# Patient Record
Sex: Female | Born: 2000
Health system: Southern US, Community
[De-identification: ages and names within clinical notes are randomized; demographics above are authoritative.]

## PROBLEM LIST (undated history)

## (undated) DIAGNOSIS — G43109 Migraine with aura, not intractable, without status migrainosus: Secondary | ICD-10-CM

## (undated) DIAGNOSIS — B009 Herpesviral infection, unspecified: Secondary | ICD-10-CM

## (undated) DIAGNOSIS — F909 Attention-deficit hyperactivity disorder, unspecified type: Secondary | ICD-10-CM

## (undated) DIAGNOSIS — J45909 Unspecified asthma, uncomplicated: Secondary | ICD-10-CM

## (undated) HISTORY — DX: Herpesviral infection, unspecified: B00.9

## (undated) HISTORY — PX: WISDOM TOOTH EXTRACTION: SHX21

## (undated) HISTORY — DX: Migraine with aura, not intractable, without status migrainosus: G43.109

## (undated) HISTORY — DX: Attention-deficit hyperactivity disorder, unspecified type: F90.9

## (undated) HISTORY — DX: Unspecified asthma, uncomplicated: J45.909

---
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
## 2020-05-22 ENCOUNTER — Telehealth: Payer: Self-pay

## 2020-05-22 NOTE — Telephone Encounter (Signed)
Left message with Jeanice Lim, ok per DPR.

## 2020-05-22 NOTE — Telephone Encounter (Signed)
Patients mother is calling in regards to patient needing an appointment for a possible UTI or yeast infection. No available appointments, need triage to assist.

## 2020-05-23 ENCOUNTER — Ambulatory Visit (INDEPENDENT_AMBULATORY_CARE_PROVIDER_SITE_OTHER): Payer: BC Managed Care – PPO | Admitting: Obstetrics and Gynecology

## 2020-05-23 ENCOUNTER — Telehealth: Payer: Self-pay

## 2020-05-23 ENCOUNTER — Other Ambulatory Visit (HOSPITAL_COMMUNITY)
Admission: RE | Admit: 2020-05-23 | Discharge: 2020-05-23 | Disposition: A | Discharge status: Home / Self Care | Payer: BC Managed Care – PPO | Source: Ambulatory Visit | Attending: Obstetrics and Gynecology | Admitting: Obstetrics and Gynecology

## 2020-05-23 ENCOUNTER — Other Ambulatory Visit: Payer: Self-pay

## 2020-05-23 ENCOUNTER — Ambulatory Visit: Payer: Self-pay | Admitting: Obstetrics & Gynecology

## 2020-05-23 ENCOUNTER — Encounter: Payer: Self-pay | Admitting: Obstetrics and Gynecology

## 2020-05-23 VITALS — BP 100/70 | HR 76 | Temp 97.5°F | Ht 64.0 in | Wt 121.0 lb

## 2020-05-23 DIAGNOSIS — Z113 Encounter for screening for infections with a predominantly sexual mode of transmission: Secondary | ICD-10-CM

## 2020-05-23 DIAGNOSIS — R300 Dysuria: Secondary | ICD-10-CM | POA: Diagnosis not present

## 2020-05-23 DIAGNOSIS — R319 Hematuria, unspecified: Secondary | ICD-10-CM

## 2020-05-23 DIAGNOSIS — N760 Acute vaginitis: Secondary | ICD-10-CM | POA: Diagnosis not present

## 2020-05-23 LAB — POCT URINALYSIS DIPSTICK
Bilirubin, UA: NEGATIVE
Glucose, UA: NEGATIVE
Ketones, UA: NEGATIVE
Leukocytes, UA: NEGATIVE
Nitrite, UA: NEGATIVE
Protein, UA: NEGATIVE
Urobilinogen, UA: 0.2 E.U./dL
pH, UA: 5 (ref 5.0–8.0)

## 2020-05-23 MED ORDER — SULFAMETHOXAZOLE-TRIMETHOPRIM 800-160 MG PO TABS: 1.0000 | ORAL_TABLET | Freq: Two times a day (BID) | ORAL | 0 refills | Status: DC

## 2020-05-23 MED ORDER — PHENAZOPYRIDINE HCL 200 MG PO TABS: 200.0000 mg | ORAL_TABLET | Freq: Three times a day (TID) | ORAL | 0 refills | Status: DC | PRN

## 2020-05-23 NOTE — Telephone Encounter (Signed)
H/O recurrent UTIs, 2019,2020 Nexplanon inserted 02/2018 AEX 10/2016  Spoke with pt. Pt states having burning and pain with urination and having intermittent lower abd pain x 3 days. Pt states has recurrent UTIs, at least once a year. Denies fever, chills, back pain, urinary urgency, frequency, vaginal discharge or odor. Pt does has standing Macrobid Rx for 1 tab po after intercourse. Pt has not used this. Denies any OTC meds for treatment. Unknown LMP due to Nexplanon.  Advised pt to be seen for further evaluation. Pt scheduled as work-in appt with Dr Hyacinth Meeker today 05/23/20 at 315 pm. Pt verbalized understanding and agreeable CPS neg.   Routing to Dr Hyacinth Meeker for review.  Encounter closed.

## 2020-05-23 NOTE — Telephone Encounter (Signed)
Patient returned call to reschedule appointment.

## 2020-05-23 NOTE — Progress Notes (Signed)
GYNECOLOGY  VISIT   HPI: 19 y.o.   Single  Caucasian  female   G0P0000 with No LMP recorded. Patient has had an implant.   here for burning with urination and having internal vaginal pain. Patient's ex-boyfriend with HSV II and patient concerned she been exposed.  Not sure if she has any lesions.   No vaginal discharge.  She did use a Monistat yesterday to try to help her symptoms.   States it doe not feel like a UTI, but it hurts with voiding.   Some pelvic pain for 2 days.  She has some random pulses in the vagina. She feels like she has growing pains in her lower legs.  No swelling.  No fevers.   Urine Dip: 1+RBCs  GYNECOLOGIC HISTORY: No LMP recorded. Patient has had an implant. Contraception: Nexplanon 02/2018 Lt.arm Menopausal hormone therapy:  N/a Last mammogram:  n/a Last pap smear:   n/a        OB History    Gravida  0   Para  0   Term  0   Preterm  0   AB  0   Living  0     SAB  0   TAB  0   Ectopic  0   Multiple  0   Live Births                 Patient Active Problem List   Diagnosis Date Noted  . Recurrent UTI 03/08/2018  . ADD (attention deficit disorder with hyperactivity) 03/31/2012    Past Medical History:  Diagnosis Date  . ADHD (attention deficit hyperactivity disorder)   . Asthma     Past Surgical History:  Procedure Laterality Date  . CRANIECTOMY FOR CRANIOSYNOSTOSIS  2013   done at [facility]  . WISDOM TOOTH EXTRACTION      Current Outpatient Medications  Medication Sig Dispense Refill  . ADDERALL XR 20 MG 24 hr capsule Take 20 mg by mouth at bedtime.    Marland Kitchen albuterol (VENTOLIN HFA) 108 (90 Base) MCG/ACT inhaler SMARTSIG:1 Puff(s) By Mouth Every 4 Hours PRN     No current facility-administered medications for this visit.     ALLERGIES: Almond (diagnostic), Apple, Carrot [daucus carota], and Pineapple  Family History  Problem Relation Age of Onset  . Diabetes Father        type II  . Throat cancer Father      Social History   Socioeconomic History  . Marital status: Single    Spouse name: Not on file  . Number of children: Not on file  . Years of education: Not on file  . Highest education level: Not on file  Occupational History  . Not on file  Tobacco Use  . Smoking status: Never Smoker  . Smokeless tobacco: Never Used  Substance and Sexual Activity  . Alcohol use: No  . Drug use: No  . Sexual activity: Yes    Birth control/protection: Condom  Other Topics Concern  . Not on file  Social History Narrative  . Not on file   Social Determinants of Health   Financial Resource Strain:   . Difficulty of Paying Living Expenses:   Food Insecurity:   . Worried About Programme researcher, broadcasting/film/video in the Last Year:   . Barista in the Last Year:   Transportation Needs:   . Freight forwarder (Medical):   Marland Kitchen Lack of Transportation (Non-Medical):   Physical Activity:   .  Days of Exercise per Week:   . Minutes of Exercise per Session:   Stress:   . Feeling of Stress :   Social Connections:   . Frequency of Communication with Friends and Family:   . Frequency of Social Gatherings with Friends and Family:   . Attends Religious Services:   . Active Member of Clubs or Organizations:   . Attends Archivist Meetings:   Marland Kitchen Marital Status:   Intimate Partner Violence:   . Fear of Current or Ex-Partner:   . Emotionally Abused:   Marland Kitchen Physically Abused:   . Sexually Abused:     Review of Systems  Genitourinary:       Burning with urination  All other systems reviewed and are negative.   PHYSICAL EXAMINATION:    BP 100/70   Pulse 76   Temp (!) 97.5 F (36.4 C) (Temporal)   Ht 5\' 4"  (1.626 m)   Wt 121 lb (54.9 kg)   BMI 20.77 kg/m     General appearance: alert, cooperative and appears stated age   Pelvic: External genitalia:  no lesions              Urethra:  normal appearing urethra with no masses, tenderness or lesions              Bartholins and Skenes: normal                  Vagina: normal appearing vagina with normal color and ?White discharge, no lesions              Cervix: no lesions.  No CMT.                Bimanual Exam:  Uterus:  normal size, contour, position, consistency, mobility, non-tender              Adnexa: no mass, fullness, tenderness              Chaperone was present for exam.  ASSESSMENT  Dysuria.  Cystitis.  Possible exposure to HSV.   PLAN  STD screening.  Vaginitis testing.  Bactrim DS po bid x 3 days.  Pyridium.  Call for no improvement.    An After Visit Summary was printed and given to the patient.  __19____ minutes face to face time of which over 50% was spent in counseling.

## 2020-05-23 NOTE — Telephone Encounter (Signed)
Patient returned call

## 2020-05-23 NOTE — Telephone Encounter (Signed)
Spoke with patient. OV rescheduled for today at 4:30pm with Dr. Edward Jolly.  Covid 19 prescreen negative, precautions review. Patient is aware of arrival time of 4:15pm for check in.   Routing to provider for final review. Patient is agreeable to disposition. Will close encounter.   Cc: Dr. Hyacinth Meeker

## 2020-05-23 NOTE — Telephone Encounter (Signed)
Patients appointment for UTI was cancelled for today due to doctor being in surgery. Needs to be rescheduled, no available slots, route to triage for assistance.

## 2020-05-23 NOTE — Telephone Encounter (Signed)
ll to patient to offer work-in appointment with Dr Edward Jolly at [phone].  Unable to reach patient. Voice mailbox is full.

## 2020-05-23 NOTE — Patient Instructions (Signed)
Urinary Tract Infection, Adult A urinary tract infection (UTI) is an infection of any part of the urinary tract. The urinary tract includes:  The kidneys.  The ureters.  The bladder.  The urethra. These organs make, store, and get rid of pee (urine) in the body. What are the causes? This is caused by germs (bacteria) in your genital area. These germs grow and cause swelling (inflammation) of your urinary tract. What increases the risk? You are more likely to develop this condition if:  You have a small, thin tube (catheter) to drain pee.  You cannot control when you pee or poop (incontinence).  You are female, and: ? You use these methods to prevent pregnancy:  A medicine that kills sperm (spermicide).  A device that blocks sperm (diaphragm). ? You have low levels of a female hormone (estrogen). ? You are pregnant.  You have genes that add to your risk.  You are sexually active.  You take antibiotic medicines.  You have trouble peeing because of: ? A prostate that is bigger than normal, if you are female. ? A blockage in the part of your body that drains pee from the bladder (urethra). ? A kidney stone. ? A nerve condition that affects your bladder (neurogenic bladder). ? Not getting enough to drink. ? Not peeing often enough.  You have other conditions, such as: ? Diabetes. ? A weak disease-fighting system (immune system). ? Sickle cell disease. ? Gout. ? Injury of the spine. What are the signs or symptoms? Symptoms of this condition include:  Needing to pee right away (urgently).  Peeing often.  Peeing small amounts often.  Pain or burning when peeing.  Blood in the pee.  Pee that smells bad or not like normal.  Trouble peeing.  Pee that is cloudy.  Fluid coming from the vagina, if you are female.  Pain in the belly or lower back. Other symptoms include:  Throwing up (vomiting).  No urge to eat.  Feeling mixed up (confused).  Being tired  and grouchy (irritable).  A fever.  Watery poop (diarrhea). How is this treated? This condition may be treated with:  Antibiotic medicine.  Other medicines.  Drinking enough water. Follow these instructions at home:  Medicines  Take over-the-counter and prescription medicines only as told by your doctor.  If you were prescribed an antibiotic medicine, take it as told by your doctor. Do not stop taking it even if you start to feel better. General instructions  Make sure you: ? Pee until your bladder is empty. ? Do not hold pee for a long time. ? Empty your bladder after sex. ? Wipe from front to back after pooping if you are a female. Use each tissue one time when you wipe.  Drink enough fluid to keep your pee pale yellow.  Keep all follow-up visits as told by your doctor. This is important. Contact a doctor if:  You do not get better after 1-2 days.  Your symptoms go away and then come back. Get help right away if:  You have very bad back pain.  You have very bad pain in your lower belly.  You have a fever.  You are sick to your stomach (nauseous).  You are throwing up. Summary  A urinary tract infection (UTI) is an infection of any part of the urinary tract.  This condition is caused by germs in your genital area.  There are many risk factors for a UTI. These include having a small, thin   tube to drain pee and not being able to control when you pee or poop.  Treatment includes antibiotic medicines for germs.  Drink enough fluid to keep your pee pale yellow. This information is not intended to replace advice given to you by your health care provider. Make sure you discuss any questions you have with your health care provider. Document Revised: 11/19/2018 Document Reviewed: 06/11/2018 Elsevier Patient Education  2020 Elsevier Inc.  

## 2020-05-23 NOTE — Progress Notes (Deleted)
GYNECOLOGY  VISIT  CC:   ***  HPI: 19 y.o. G0P0000 Single White or Caucasian female here for uti.  GYNECOLOGIC HISTORY: No LMP recorded. Patient has had an implant. Contraception: nexplanon Menopausal hormone therapy: none  Patient Active Problem List   Diagnosis Date Noted  . Recurrent UTI 03/08/2018  . ADD (attention deficit disorder with hyperactivity) 03/31/2012    Past Medical History:  Diagnosis Date  . ADHD (attention deficit hyperactivity disorder)   . Asthma     Past Surgical History:  Procedure Laterality Date  . CRANIECTOMY FOR CRANIOSYNOSTOSIS  2013   done at [facility]  . WISDOM TOOTH EXTRACTION      MEDS:   Current Outpatient Medications on File Prior to Visit  Medication Sig Dispense Refill  . ADDERALL XR 30 MG 24 hr capsule Take 1 tablet by mouth daily.  0  . ALBUTEROL IN Inhale into the lungs as needed.    . metroNIDAZOLE (METROGEL) 0.75 % vaginal gel Place 1 Applicatorful vaginally at bedtime. Use for 5 nights 70 g 0  . nitrofurantoin, macrocrystal-monohydrate, (MACROBID) 100 MG capsule 1 tab po within 30 minutes of intercourse 30 capsule 1  . phenazopyridine (PYRIDIUM) 200 MG tablet Take 1 tablet (200 mg total) by mouth 3 (three) times daily as needed. 6 tablet 0  . sulfamethoxazole-trimethoprim (BACTRIM DS) 800-160 MG tablet Take 1 tablet by mouth 2 (two) times daily. One PO BID x 3 days 6 tablet 0   No current facility-administered medications on file prior to visit.    ALLERGIES: Almond (diagnostic), Apple, Carrot [daucus carota], and Pineapple  Family History  Problem Relation Age of Onset  . Diabetes Father        type II  . Throat cancer Father     SH:  ***  Review of Systems  PHYSICAL EXAMINATION:    There were no vitals taken for this visit.    General appearance: alert, cooperative and appears stated age Neck: no adenopathy, supple, symmetrical, trachea midline and thyroid {CHL AMB PHY EX THYROID NORM DEFAULT:[phone]::"normal to  inspection and palpation"} CV:  {Exam; heart brief:31539} Lungs:  {pe lungs ob:314451::"clear to auscultation, no wheezes, rales or rhonchi, symmetric air entry"} Breasts: {Exam; breast:13139::"normal appearance, no masses or tenderness"} Abdomen: soft, non-tender; bowel sounds normal; no masses,  no organomegaly Lymph:  no inguinal LAD noted  Pelvic: External genitalia:  no lesions              Urethra:  normal appearing urethra with no masses, tenderness or lesions              Bartholins and Skenes: normal                 Vagina: normal appearing vagina with normal color and discharge, no lesions              Cervix: {CHL AMB PHY EX CERVIX NORM [phone]::"no lesions"}              Bimanual Exam:  Uterus:  {CHL AMB PHY EX UTERUS NORM DEFAULT:[phone]::"normal size, contour, position, consistency, mobility, non-tender"}              Adnexa: {CHL AMB PHY EX ADNEXA NO MASS [phone]::"no mass, fullness, tenderness"}              Rectovaginal: {yes no:314532}.  Confirms.              Anus:  normal sphincter tone, no lesions  Chaperone, ***Terence Lux, CMA, was present  for exam.  Assessment: ***  Plan: ***   ~{NUMBERS; -10-45 JOINT ROM:10287} minutes spent with patient >50% of time was in face to face discussion of above.

## 2020-05-24 LAB — HSV 1 AND 2 IGM ABS, INDIRECT
HSV 1 IgM: <1:10 {titer}
HSV 2 IgM: <1:10 {titer}

## 2020-05-24 LAB — HEP, RPR, HIV PANEL
HIV Screen 4th Generation wRfx: NONREACTIVE
Hepatitis B Surface Ag: NEGATIVE
RPR Ser Ql: NONREACTIVE

## 2020-05-24 LAB — HSV(HERPES SIMPLEX VRS) I + II AB-IGG
HSV 1 Glycoprotein G Ab, IgG: <0.91 index (ref 0.00–0.90)
HSV 2 IgG, Type Spec: <0.91 index (ref 0.00–0.90)

## 2020-05-24 LAB — URINALYSIS, MICROSCOPIC ONLY
Bacteria, UA: NONE SEEN
Casts: NONE SEEN /lpf

## 2020-05-24 LAB — HEPATITIS C ANTIBODY: Hep C Virus Ab: <0.1 s/co ratio (ref 0.0–0.9)

## 2020-05-25 LAB — CERVICOVAGINAL ANCILLARY ONLY
Bacterial Vaginitis (gardnerella): NEGATIVE
Candida Glabrata: NEGATIVE
Candida Vaginitis: NEGATIVE
Chlamydia: NEGATIVE
Comment: NEGATIVE
Comment: NEGATIVE
Comment: NEGATIVE
Comment: NEGATIVE
Comment: NEGATIVE
Comment: NORMAL
Neisseria Gonorrhea: NEGATIVE
Trichomonas: NEGATIVE

## 2020-05-25 LAB — URINE CULTURE: Organism ID, Bacteria: NO GROWTH

## 2020-05-29 ENCOUNTER — Telehealth: Payer: Self-pay | Admitting: Obstetrics and Gynecology

## 2020-05-29 DIAGNOSIS — R319 Hematuria, unspecified: Secondary | ICD-10-CM

## 2020-05-29 NOTE — Telephone Encounter (Signed)
Patient called asking if Dr.Silva has reviewed her recent results.

## 2020-05-29 NOTE — Telephone Encounter (Signed)
Spoke with pt. Pt given results and recommendations per Dr Edward Jolly. Pt agreeable and verbalized understanding. Pt states not having any pain or UTI sx. Pt states started cycle on 6/13 and finished abx on 6/12.  Advised will let know if Dr Hyacinth Meeker wants to have a recheck urine due to red blood cells. Denies any bleeding in urine while taking abx.   Routing to Dr Hyacinth Meeker to advise on recheck of urine?   Candace Salles, MD  P [facility] Triage Pool Cc: Jerene Bears, MD Please contact patient with results of testing and to schedule follow up.  This is Dr. Danna Hefty patient that I saw in her absence for an office visit.   She tested negative for herpes type I and II, HIV, syphilis, hepatitis B and C, gonorrhea, chlamydia, and trichomonas.   Her vaginitis testing is negative for yeast and bacterial vaginosis.   Her urine microscopic exam showed red blood cells and her urine culture is negative for infection.  She can stop her antibiotic if she has not already completed the course.   I would recommend a follow up visit to see how her pain is doing and to recheck a urine.

## 2020-05-30 NOTE — Telephone Encounter (Signed)
Yes, I would like her to return for a urine micro only.  Does not need MD visit for this.  Thanks.

## 2020-05-30 NOTE — Telephone Encounter (Signed)
Spoke with pt. Pt scheduled repeat Micro UA only for nurse visit on 06/01/20 at 3:45 pm per pt's request of date and time. Pt verbalized understanding of appt.  Orders placed.  Routing to Dr Hyacinth Meeker for review.  Encounter closed.

## 2020-05-30 NOTE — Telephone Encounter (Signed)
Spoke to pt. Pt given recommendations per Dr Hyacinth Meeker. Pt ok to give urine sample for repeat micro only. Pt states will call back after 3 pm today when off work to schedule.

## 2020-06-01 ENCOUNTER — Other Ambulatory Visit: Payer: Self-pay

## 2020-06-01 ENCOUNTER — Ambulatory Visit (INDEPENDENT_AMBULATORY_CARE_PROVIDER_SITE_OTHER): Payer: BC Managed Care – PPO

## 2020-06-01 DIAGNOSIS — R319 Hematuria, unspecified: Secondary | ICD-10-CM | POA: Diagnosis not present

## 2020-06-01 NOTE — Progress Notes (Signed)
Patient in office for a repeat microscopic urine. Patient states that she has completed the course of antibiotics and pyridium. Clean-catch urine obtained and sent to the lab to be resulted. Routing to provider for final review and closing encounter.

## 2020-06-02 LAB — URINALYSIS, MICROSCOPIC ONLY
Bacteria, UA: NONE SEEN
Casts: NONE SEEN /lpf
WBC, UA: NONE SEEN /hpf (ref 0–5)

## 2020-06-08 ENCOUNTER — Telehealth: Payer: Self-pay

## 2020-06-08 NOTE — Telephone Encounter (Signed)
Mailbox full. Try again. 

## 2020-06-08 NOTE — Telephone Encounter (Signed)
-----   Message from Jerene Bears, MD sent at 06/07/2020  4:20 PM EDT ----- Please let pt know her urine micro still shows some blood.  There does not appear to be any infection.  She has amorphous sediment crystals in her urine so may have stones.  She should have a urology consultation for this.  I would recommend Dr. Arita Miss.  Thanks.

## 2020-06-08 NOTE — Telephone Encounter (Signed)
Patient notified of results. See lab 

## 2020-07-24 ENCOUNTER — Other Ambulatory Visit: Payer: Self-pay | Admitting: Obstetrics and Gynecology

## 2020-08-17 ENCOUNTER — Other Ambulatory Visit: Payer: Self-pay | Admitting: Urology

## 2020-08-17 DIAGNOSIS — R311 Benign essential microscopic hematuria: Secondary | ICD-10-CM

## 2020-08-24 ENCOUNTER — Ambulatory Visit
Admission: RE | Admit: 2020-08-24 | Discharge: 2020-08-24 | Disposition: A | Discharge status: Home / Self Care | Payer: BC Managed Care – PPO | Source: Ambulatory Visit | Attending: Urology | Admitting: Urology

## 2020-08-24 DIAGNOSIS — R311 Benign essential microscopic hematuria: Secondary | ICD-10-CM

## 2020-11-06 ENCOUNTER — Telehealth: Payer: Self-pay

## 2020-11-06 DIAGNOSIS — Z3046 Encounter for surveillance of implantable subdermal contraceptive: Secondary | ICD-10-CM

## 2020-11-06 NOTE — Telephone Encounter (Signed)
Spoke with patient regarding benefits for scheduled Nexplanon Removal. Patient acknowledges understanding of information presented. Encounter closed.

## 2020-11-06 NOTE — Telephone Encounter (Signed)
Patient is returning call.  °

## 2020-11-06 NOTE — Telephone Encounter (Signed)
Patient want to discuss nexplanon removal.

## 2020-11-06 NOTE — Telephone Encounter (Signed)
Left message for pt to return call to triage RN. 

## 2020-11-06 NOTE — Telephone Encounter (Signed)
Nexplanon placed 02/2018 with Dr Hyacinth Meeker, due for removal 02/2021. LMP 11/15  Spoke with pt. Pt requesting Nexplanon removal due to having irregular, long cycles. Pt states changing a tampon every 2-3 hours and cycles lasting 2.5 weeks on average. Pt states got Nexplanon placed due to " not wanting monthly cycles" Pt states using 1.5 boxes of tampons every month for bleeding. Pt denies any heavy bleeding, soaking tampons every 1 hour or less, clots, cramps. Denies feeling weak, dizzy or lightheaded.   Advised pt to have OV for removal and discussion of new birth control method. Pt agreeable. Pt scheduled with Dr Edward Jolly on 11/30 at 930 am for Nexplanon removal and discussion. Pt verbalized understanding to date and time of appt. Pt advised to remain abstinent until appt. Pt agreeable.  Routing to Dr Edward Jolly for review Cc: Hayley for precert  Orders placed  Encounter closed

## 2020-11-08 NOTE — Progress Notes (Signed)
GYNECOLOGY  VISIT   HPI: 19 y.o.   Single  Caucasian  female   G0P0000 with Patient's last menstrual period was 10/31/2020 (approximate).   here for Nexplanon removal. She would like to go back on the pill.   Lots of irregular bleeding with Nexplanon.   She wants to be a on a pill.  Had a rash with OrthoEvra.    She has migraines with possible auditory aura.  She gets ringing in her ears prior to migraines.   GYNECOLOGIC HISTORY: Patient's last menstrual period was 10/31/2020 (approximate). Contraception:  Nexplanon 02/2018 Lt.arm Menopausal hormone therapy:  n/a Last mammogram:  n/a Last pap smear:   n/a        OB History    Gravida  0   Para  0   Term  0   Preterm  0   AB  0   Living  0     SAB  0   TAB  0   Ectopic  0   Multiple  0   Live Births                 Patient Active Problem List   Diagnosis Date Noted  . Recurrent UTI 03/08/2018  . ADD (attention deficit disorder with hyperactivity) 03/31/2012    Past Medical History:  Diagnosis Date  . ADHD (attention deficit hyperactivity disorder)   . Asthma     Past Surgical History:  Procedure Laterality Date  . CRANIECTOMY FOR CRANIOSYNOSTOSIS  2013   done at [facility]  . WISDOM TOOTH EXTRACTION      Current Outpatient Medications  Medication Sig Dispense Refill  . ADDERALL XR 20 MG 24 hr capsule Take 20 mg by mouth at bedtime.    Marland Kitchen albuterol (VENTOLIN HFA) 108 (90 Base) MCG/ACT inhaler SMARTSIG:1 Puff(s) By Mouth Every 4 Hours PRN    . etonogestrel (NEXPLANON) 68 MG IMPL implant 1 each by Subdermal route once.     No current facility-administered medications for this visit.     ALLERGIES: Almond (diagnostic), Apple, and Carrot [daucus carota]  Family History  Problem Relation Age of Onset  . Diabetes Father        type II  . Throat cancer Father     Social History   Socioeconomic History  . Marital status: Single    Spouse name: Not on file  . Number of children: Not on file  .  Years of education: Not on file  . Highest education level: Not on file  Occupational History  . Not on file  Tobacco Use  . Smoking status: Never Smoker  . Smokeless tobacco: Never Used  Substance and Sexual Activity  . Alcohol use: No  . Drug use: No  . Sexual activity: Yes    Birth control/protection: Condom  Other Topics Concern  . Not on file  Social History Narrative  . Not on file   Social Determinants of Health   Financial Resource Strain:   . Difficulty of Paying Living Expenses: Not on file  Food Insecurity:   . Worried About Programme researcher, broadcasting/film/video in the Last Year: Not on file  . Ran Out of Food in the Last Year: Not on file  Transportation Needs:   . Lack of Transportation (Medical): Not on file  . Lack of Transportation (Non-Medical): Not on file  Physical Activity:   . Days of Exercise per Week: Not on file  . Minutes of Exercise per Session: Not on file  Stress:   . Feeling of Stress : Not on file  Social Connections:   . Frequency of Communication with Friends and Family: Not on file  . Frequency of Social Gatherings with Friends and Family: Not on file  . Attends Religious Services: Not on file  . Active Member of Clubs or Organizations: Not on file  . Attends Banker Meetings: Not on file  . Marital Status: Not on file  Intimate Partner Violence:   . Fear of Current or Ex-Partner: Not on file  . Emotionally Abused: Not on file  . Physically Abused: Not on file  . Sexually Abused: Not on file    Review of Systems  All other systems reviewed and are negative.   PHYSICAL EXAMINATION:    BP 100/62   Pulse 70   Ht 5\' 4"  (1.626 m)   Wt 126 lb 12.8 oz (57.5 kg)   LMP 10/31/2020 (Approximate)   BMI 21.77 kg/m     General appearance: alert, cooperative and appears stated age   Extremities: extremities normal, atraumatic, no cyanosis or edema   Left arm - Nexplanon removal.  Nexplanon palpated. Betadine prep. Local 1% lidocaine - lot  11/02/2020 m exp 5/24.  Incision with scalpel. Hemostat used to remove Nexplanon intact, shown to patient, and discarded. Steristrips placed.  Sterile gauze wrap.  No complications.  Minimal EBL.  Chaperone was present for exam.  ASSESSMENT  Nexplanon removal.  Migraine with possible aura.  Need for contraception.   PLAN  Post Nexplanon removal precautions to patient.  Expect potential menses. Contraceptive counseling given.   Micronor Rx for 3 months.  Instructed in use.  Follow up for annual exam.

## 2020-11-14 ENCOUNTER — Encounter: Payer: Self-pay | Admitting: Obstetrics and Gynecology

## 2020-11-14 ENCOUNTER — Ambulatory Visit (INDEPENDENT_AMBULATORY_CARE_PROVIDER_SITE_OTHER): Payer: BC Managed Care – PPO | Admitting: Obstetrics and Gynecology

## 2020-11-14 ENCOUNTER — Other Ambulatory Visit: Payer: Self-pay

## 2020-11-14 VITALS — BP 100/62 | HR 70 | Ht 64.0 in | Wt 126.8 lb

## 2020-11-14 DIAGNOSIS — Z3009 Encounter for other general counseling and advice on contraception: Secondary | ICD-10-CM

## 2020-11-14 DIAGNOSIS — Z3046 Encounter for surveillance of implantable subdermal contraceptive: Secondary | ICD-10-CM | POA: Diagnosis not present

## 2020-11-14 MED ORDER — NORETHINDRONE 0.35 MG PO TABS: 1.0000 | ORAL_TABLET | Freq: Every day | ORAL | 0 refills | Status: DC

## 2021-01-03 ENCOUNTER — Telehealth: Payer: Self-pay | Admitting: *Deleted

## 2021-01-03 NOTE — Telephone Encounter (Signed)
Patient called and left message in triage voicemail c/o UTI, I called patient back and explained office needed for treatment. Sent message to appointments to schedule.

## 2021-01-08 NOTE — Progress Notes (Signed)
20 y.o. G0P0000 Single White or Caucasian female here for annual exam.    Has hx severe headaches, localized in pin-point area over left eye. Last the whole day when it comes, takes OTC migraine medicine and it helps if she takes it early enough. In the past 12 months has had 4 headaches. Has never been seen by neurology for headaches.  Currently taking progestin only birth control pills for contraception. Has a hard time remembering them on time. She is a Consulting civil engineer at [employer] and her schedule varies and is busy. Per note on office visit with Dr. Edward Jolly on 11/14/2020, migraines with possible auditory aura with ringing in ears prior to migraines.  Pt desires more effective birth control pills, agrees to consult neurology for opinion regarding safety of combination oral contraception (estrogen plus progestin).  Also discussed pros and cons of IUD, suggested Kyleena, pt is not interested in IUD at this time. Tried Nexplanon, bled constantly.    Reports odor in urine x 2-3 days. Reports always has a little blood in urine, has been evaluated with urology. Used to take macrobid after intercourse but ran out of Rx several months ago. Has not had a bladder infection in the last 6 months. Will send urine off for culture.   Patient's last menstrual period was 01/02/2021 (exact date).          Sexually active: Yes.    The current method of family planning is oral progesterone-only contraceptive.    Exercising: Yes.    gym Smoker:  no  Health Maintenance: Pap:  none History of abnormal Pap:  no MMG:  none Colonoscopy:  none BMD:   none TDaP:  2012 Gardasil:  completed  Covid-19: not done Hep C testing: neg 2021   reports that she has never smoked. She has never used smokeless tobacco. She reports that she does not drink alcohol and does not use drugs.  Past Medical History:  Diagnosis Date  . ADHD (attention deficit hyperactivity disorder)   . Asthma   . Migraine with aura    auditroy aura     Past Surgical History:  Procedure Laterality Date  . CRANIECTOMY FOR CRANIOSYNOSTOSIS  2013   done at [facility]  . WISDOM TOOTH EXTRACTION      Current Outpatient Medications  Medication Sig Dispense Refill  . ADDERALL XR 20 MG 24 hr capsule Take 20 mg by mouth at bedtime.    Marland Kitchen albuterol (VENTOLIN HFA) 108 (90 Base) MCG/ACT inhaler SMARTSIG:1 Puff(s) By Mouth Every 4 Hours PRN    . norethindrone (MICRONOR) 0.35 MG tablet Take 1 tablet (0.35 mg total) by mouth daily. 84 tablet 0   No current facility-administered medications for this visit.    Family History  Problem Relation Age of Onset  . Diabetes Father        type II  . Throat cancer Father     Review of Systems  Constitutional: Negative.   HENT: Negative.   Eyes: Negative.   Respiratory: Negative.   Cardiovascular: Negative.   Gastrointestinal: Negative.   Endocrine: Negative.   Genitourinary: Negative.  Negative for dysuria, frequency and urgency.       Odor in urine  Musculoskeletal: Negative.   Skin: Negative.   Allergic/Immunologic: Negative.   Neurological: Negative.   Hematological: Negative.   Psychiatric/Behavioral: Negative.     Exam:   Ht 5\' 4"  (1.626 m)   Wt 122 lb (55.3 kg)   LMP 01/02/2021 (Exact Date)   BMI 20.94 kg/m  Height: 5\' 4"  (162.6 cm)  General appearance: alert, cooperative and appears stated age, no acute distress Head: Hx craniectomy  Neck: no adenopathy, thyroid normal to inspection and palpation Lungs: clear to auscultation bilaterally Breasts: Not assessed today r/t age Heart: regular rate and rhythm NEG CVAT Abdomen: soft, non-tender; no masses,  no organomegaly Extremities: extremities normal, no edema Skin: No rashes or lesions Lymph nodes: Cervical, supraclavicular, and axillary nodes normal. No abnormal inguinal nodes palpated Neurologic: Grossly normal   Pelvic: External genitalia:  no lesions              Urethra:  normal appearing urethra with no masses,  tenderness or lesions              Bartholins and Skenes: normal                 Vagina: normal appearing vagina, appropriate for age, yellow/tan discharge              Cervix: neg cervical motion tenderness, no visible lesions             Bimanual Exam:   Uterus:  normal size, contour, position, consistency, mobility, non-tender              Adnexa: no mass, fullness, tenderness                 Joy, CMA Chaperone was present for exam.  A:  Well woman exam with routine gynecological exam  Laboratory exam ordered as part of routine general medical examination - Plan: Urinalysis,Complete w/RFL Culture  Screen for STD (sexually transmitted disease) - Plan: WET PREP FOR TRICH, YEAST, CLUE, C. trachomatis/N. gonorrhoeae RNA  Hematuria, unspecified type - Plan: nitrofurantoin, macrocrystal-monohydrate, (MACROBID) 100 MG capsule  Surveillance of previously prescribed contraceptive pill - Plan: norethindrone (MICRONOR) 0.35 MG tablet  Periodic headache syndrome, not intractable - Plan: Ambulatory referral to Neurology    P:   Pap : due at age 64  Labs: GC/CT  Medications: Refill Norethindrone 0.35, #3 packs/4 RF    Macrobid 100mg  BID x 7 days  Discussed after Neurology consult, will further discuss birth control plan. Information provided about Kyleena IUD. Encouraged condom use with Norethindrone.

## 2021-01-09 ENCOUNTER — Encounter: Payer: Self-pay | Admitting: Nurse Practitioner

## 2021-01-09 ENCOUNTER — Other Ambulatory Visit: Payer: Self-pay

## 2021-01-09 ENCOUNTER — Ambulatory Visit (INDEPENDENT_AMBULATORY_CARE_PROVIDER_SITE_OTHER): Payer: BC Managed Care – PPO | Admitting: Nurse Practitioner

## 2021-01-09 ENCOUNTER — Encounter: Payer: Self-pay | Admitting: Neurology

## 2021-01-09 VITALS — BP 110/78 | HR 70 | Resp 16 | Ht 64.0 in | Wt 122.0 lb

## 2021-01-09 DIAGNOSIS — Z113 Encounter for screening for infections with a predominantly sexual mode of transmission: Secondary | ICD-10-CM

## 2021-01-09 DIAGNOSIS — R319 Hematuria, unspecified: Secondary | ICD-10-CM

## 2021-01-09 DIAGNOSIS — Z3041 Encounter for surveillance of contraceptive pills: Secondary | ICD-10-CM

## 2021-01-09 DIAGNOSIS — N3090 Cystitis, unspecified without hematuria: Secondary | ICD-10-CM

## 2021-01-09 DIAGNOSIS — Z01419 Encounter for gynecological examination (general) (routine) without abnormal findings: Secondary | ICD-10-CM | POA: Diagnosis not present

## 2021-01-09 DIAGNOSIS — Z0000 Encounter for general adult medical examination without abnormal findings: Secondary | ICD-10-CM

## 2021-01-09 DIAGNOSIS — G43C0 Periodic headache syndromes in child or adult, not intractable: Secondary | ICD-10-CM

## 2021-01-09 LAB — WET PREP FOR TRICH, YEAST, CLUE

## 2021-01-09 MED ORDER — NITROFURANTOIN MONOHYD MACRO 100 MG PO CAPS: 100.0000 mg | ORAL_CAPSULE | Freq: Two times a day (BID) | ORAL | 0 refills | Status: DC

## 2021-01-09 MED ORDER — NORETHINDRONE 0.35 MG PO TABS: 1.0000 | ORAL_TABLET | Freq: Every day | ORAL | 4 refills | Status: DC

## 2021-01-09 NOTE — Patient Instructions (Addendum)
Health Maintenance, Female Adopting a healthy lifestyle and getting preventive care are important in promoting health and wellness. Ask your health care provider about:  The right schedule for you to have regular tests and exams.  Things you can do on your own to prevent diseases and keep yourself healthy. What should I know about diet, weight, and exercise? Eat a healthy diet  Eat a diet that includes plenty of vegetables, fruits, low-fat dairy products, and lean protein.  Do not eat a lot of foods that are high in solid fats, added sugars, or sodium.   Maintain a healthy weight Body mass index (BMI) is used to identify weight problems. It estimates body fat based on height and weight. Your health care provider can help determine your BMI and help you achieve or maintain a healthy weight. Get regular exercise Get regular exercise. This is one of the most important things you can do for your health. Most adults should:  Exercise for at least 150 minutes each week. The exercise should increase your heart rate and make you sweat (moderate-intensity exercise).  Do strengthening exercises at least twice a week. This is in addition to the moderate-intensity exercise.  Spend less time sitting. Even light physical activity can be beneficial. Watch cholesterol and blood lipids Have your blood tested for lipids and cholesterol at 20 years of age, then have this test every 5 years. Have your cholesterol levels checked more often if:  Your lipid or cholesterol levels are high.  You are older than 20 years of age.  You are at high risk for heart disease. What should I know about cancer screening? Depending on your health history and family history, you may need to have cancer screening at various ages. This may include screening for:  Breast cancer.  Cervical cancer.  Colorectal cancer.  Skin cancer.  Lung cancer. What should I know about heart disease, diabetes, and high blood  pressure? Blood pressure and heart disease  High blood pressure causes heart disease and increases the risk of stroke. This is more likely to develop in people who have high blood pressure readings, are of African descent, or are overweight.  Have your blood pressure checked: ? Every 3-5 years if you are 18-39 years of age. ? Every year if you are 40 years old or older. Diabetes Have regular diabetes screenings. This checks your fasting blood sugar level. Have the screening done:  Once every three years after age 40 if you are at a normal weight and have a low risk for diabetes.  More often and at a younger age if you are overweight or have a high risk for diabetes. What should I know about preventing infection? Hepatitis B If you have a higher risk for hepatitis B, you should be screened for this virus. Talk with your health care provider to find out if you are at risk for hepatitis B infection. Hepatitis C Testing is recommended for:  Everyone born from 1945 through 1965.  Anyone with known risk factors for hepatitis C. Sexually transmitted infections (STIs)  Get screened for STIs, including gonorrhea and chlamydia, if: ? You are sexually active and are younger than 20 years of age. ? You are older than 20 years of age and your health care provider tells you that you are at risk for this type of infection. ? Your sexual activity has changed since you were last screened, and you are at increased risk for chlamydia or gonorrhea. Ask your health care provider   if you are at risk.  Ask your health care provider about whether you are at high risk for HIV. Your health care provider may recommend a prescription medicine to help prevent HIV infection. If you choose to take medicine to prevent HIV, you should first get tested for HIV. You should then be tested every 3 months for as long as you are taking the medicine. Pregnancy  If you are about to stop having your period (premenopausal) and  you may become pregnant, seek counseling before you get pregnant.  Take 400 to 800 micrograms (mcg) of folic acid every day if you become pregnant.  Ask for birth control (contraception) if you want to prevent pregnancy. Osteoporosis and menopause Osteoporosis is a disease in which the bones lose minerals and strength with aging. This can result in bone fractures. If you are 63 years old or older, or if you are at risk for osteoporosis and fractures, ask your health care provider if you should:  Be screened for bone loss.  Take a calcium or vitamin D supplement to lower your risk of fractures.  Be given hormone replacement therapy (HRT) to treat symptoms of menopause. Follow these instructions at home: Lifestyle  Do not use any products that contain nicotine or tobacco, such as cigarettes, e-cigarettes, and chewing tobacco. If you need help quitting, ask your health care provider.  Do not use street drugs.  Do not share needles.  Ask your health care provider for help if you need support or information about quitting drugs. Alcohol use  Do not drink alcohol if: ? Your health care provider tells you not to drink. ? You are pregnant, may be pregnant, or are planning to become pregnant.  If you drink alcohol: ? Limit how much you use to 0-1 drink a day. ? Limit intake if you are breastfeeding.  Be aware of how much alcohol is in your drink. In the [place] one drink equals one 12 oz bottle of beer (355 mL), one 5 oz glass of wine (148 mL), or one 1 oz glass of hard liquor (44 mL). General instructions  Schedule regular health, dental, and eye exams.  Stay current with your vaccines.  Tell your health care provider if: ? You often feel depressed. ? You have ever been abused or do not feel safe at home. Summary  Adopting a healthy lifestyle and getting preventive care are important in promoting health and wellness.  Follow your health care provider's instructions about healthy  diet, exercising, and getting tested or screened for diseases.  Follow your health care provider's instructions on monitoring your cholesterol and blood pressure. This information is not intended to replace advice given to you by your health care provider. Make sure you discuss any questions you have with your health care provider. Document Revised: 11/25/2018 Document Reviewed: 11/25/2018 Elsevier Patient Education  2021 [employer] Inc[employer]  Urinary Tract Infection, Adult A urinary tract infection (UTI) is an infection of any part of the urinary tract. The urinary tract includes:  The kidneys.  The ureters.  The bladder.  The urethra. These organs make, store, and get rid of pee (urine) in the body. What are the causes? This infection is caused by germs (bacteria) in your genital area. These germs grow and cause swelling (inflammation) of your urinary tract. What increases the risk? The following factors may make you more likely to develop this condition:  Using a small, thin tube (catheter) to drain pee.  Not being able to control when  you pee or poop (incontinence).  Being female. If you are female, these things can increase the risk: ? Using these methods to prevent pregnancy:  A medicine that kills sperm (spermicide).  A device that blocks sperm (diaphragm). ? Having low levels of a female hormone (estrogen). ? Being pregnant. You are more likely to develop this condition if:  You have genes that add to your risk.  You are sexually active.  You take antibiotic medicines.  You have trouble peeing because of: ? A prostate that is bigger than normal, if you are female. ? A blockage in the part of your body that drains pee from the bladder. ? A kidney stone. ? A nerve condition that affects your bladder. ? Not getting enough to drink. ? Not peeing often enough.  You have other conditions, such as: ? Diabetes. ? A weak disease-fighting system (immune system). ? Sickle  cell disease. ? Gout. ? Injury of the spine. What are the signs or symptoms? Symptoms of this condition include:  Needing to pee right away.  Peeing small amounts often.  Pain or burning when peeing.  Blood in the pee.  Pee that smells bad or not like normal.  Trouble peeing.  Pee that is cloudy.  Fluid coming from the vagina, if you are female.  Pain in the belly or lower back. Other symptoms include:  Vomiting.  Not feeling hungry.  Feeling mixed up (confused). This may be the first symptom in older adults.  Being tired and grouchy (irritable).  A fever.  Watery poop (diarrhea). How is this treated?  Taking antibiotic medicine.  Taking other medicines.  Drinking enough water. In some cases, you may need to see a specialist. Follow these instructions at home: Medicines  Take over-the-counter and prescription medicines only as told by your doctor.  If you were prescribed an antibiotic medicine, take it as told by your doctor. Do not stop taking it even if you start to feel better. General instructions  Make sure you: ? Pee until your bladder is empty. ? Do not hold pee for a long time. ? Empty your bladder after sex. ? Wipe from front to back after peeing or pooping if you are a female. Use each tissue one time when you wipe.  Drink enough fluid to keep your pee pale yellow.  Keep all follow-up visits.   Contact a doctor if:  You do not get better after 1-2 days.  Your symptoms go away and then come back. Get help right away if:  You have very bad back pain.  You have very bad pain in your lower belly.  You have a fever.  You have chills.  You feeling like you will vomit or you vomit. Summary  A urinary tract infection (UTI) is an infection of any part of the urinary tract.  This condition is caused by germs in your genital area.  There are many risk factors for a UTI.  Treatment includes antibiotic medicines.  Drink enough fluid  to keep your pee pale yellow. This information is not intended to replace advice given to you by your health care provider. Make sure you discuss any questions you have with your health care provider. Document Revised: 07/14/2020 Document Reviewed: 07/14/2020 Elsevier Patient Education  2021 [employer].

## 2021-01-10 LAB — C. TRACHOMATIS/N. GONORRHOEAE RNA
C. trachomatis RNA, TMA: NOT DETECTED
N. gonorrhoeae RNA, TMA: NOT DETECTED

## 2021-01-12 LAB — URINALYSIS, COMPLETE W/RFL CULTURE
Bilirubin Urine: NEGATIVE
Glucose, UA: NEGATIVE
Hyaline Cast: NONE SEEN /LPF
Ketones, ur: NEGATIVE
Leukocyte Esterase: NEGATIVE
Nitrites, Initial: NEGATIVE
Protein, ur: NEGATIVE
Specific Gravity, Urine: 1.025 (ref 1.001–1.03)
pH: 6.5 (ref 5.0–8.0)

## 2021-01-12 LAB — URINE CULTURE
MICRO NUMBER:: 11454073
SPECIMEN QUALITY:: ADEQUATE

## 2021-01-12 LAB — CULTURE INDICATED

## 2021-02-05 ENCOUNTER — Other Ambulatory Visit: Payer: Self-pay | Admitting: Obstetrics and Gynecology

## 2021-02-05 DIAGNOSIS — Z3041 Encounter for surveillance of contraceptive pills: Secondary | ICD-10-CM

## 2021-03-16 NOTE — Progress Notes (Deleted)
NEUROLOGY CONSULTATION NOTE  Candace Spencer MRN: [id number] DOB: [birth year]  Referring provider: Clarita Crane, NP Primary care provider: Jonette Mate, MD  Reason for consult:  headache  Assessment/Plan:   ***   Subjective:  Candace Spencer is a 20 year old ***-handed female with migraines, ADHD and history of craniosynostosis s/p craniectomy who presents for headaches.  History supplemented by referring provider's note.  Onset:  *** Location:  *** Quality:  *** Intensity:  ***.  *** denies new headache, thunderclap headache or severe headache that wakes *** from sleep. Aura:  *** Prodrome:  *** Postdrome:  *** Associated symptoms:  ***.  *** denies associated unilateral numbness or weakness. Duration:  *** Frequency:  *** Frequency of abortive medication: *** Triggers:  *** Relieving factors:  *** Activity:  ***  Current NSAIDS/analgesics:  *** Current triptans:  none Current ergotamine:  none Current anti-emetic:  none Current muscle relaxants:  none Current Antihypertensive medications:  none Current Antidepressant medications:  none Current Anticonvulsant medications:  none Current anti-CGRP:  none Current Vitamins/Herbal/Supplements:  none Current Antihistamines/Decongestants:  none Other therapy:  none Hormone/birth control:  norethindrone Other medications:  Adderall XR  Past NSAIDS/analgesics:  *** Past abortive triptans:  *** Past abortive ergotamine:  none Past muscle relaxants:  none Past anti-emetic:  none Past antihypertensive medications:  none Past antidepressant medications:  none Past anticonvulsant medications:  none Past anti-CGRP:  none Past vitamins/Herbal/Supplements:  none Past antihistamines/decongestants:  none Other past therapies:  none  Caffeine:  *** Alcohol:  *** Smoker:  *** Diet:  *** Exercise:  *** Depression:  ***; Anxiety:  *** Other pain:  *** Sleep hygiene:  *** Family history of headache:   ***      PAST MEDICAL HISTORY: Past Medical History:  Diagnosis Date  . ADHD (attention deficit hyperactivity disorder)   . Asthma   . Migraine with aura    auditroy aura    PAST SURGICAL HISTORY: Past Surgical History:  Procedure Laterality Date  . CRANIECTOMY FOR CRANIOSYNOSTOSIS  2013   done at [facility]  . WISDOM TOOTH EXTRACTION      MEDICATIONS: Current Outpatient Medications on File Prior to Visit  Medication Sig Dispense Refill  . ADDERALL XR 20 MG 24 hr capsule Take 20 mg by mouth at bedtime.    Marland Kitchen albuterol (VENTOLIN HFA) 108 (90 Base) MCG/ACT inhaler SMARTSIG:1 Puff(s) By Mouth Every 4 Hours PRN    . nitrofurantoin, macrocrystal-monohydrate, (MACROBID) 100 MG capsule Take 1 capsule (100 mg total) by mouth 2 (two) times daily. 14 capsule 0  . norethindrone (MICRONOR) 0.35 MG tablet Take 1 tablet (0.35 mg total) by mouth daily. 84 tablet 4   No current facility-administered medications on file prior to visit.    ALLERGIES: Allergies  Allergen Reactions  . Almond (Diagnostic) Itching  . Apple Itching  . Carrot [Daucus Carota] Itching    FAMILY HISTORY: Family History  Problem Relation Age of Onset  . Diabetes Father        type II  . Throat cancer Father     Objective:  *** General: No acute distress.  Patient appears well-groomed.   Head:  Normocephalic/atraumatic Eyes:  fundi examined but not visualized Neck: supple, no paraspinal tenderness, full range of motion Back: No paraspinal tenderness Heart: regular rate and rhythm Lungs: Clear to auscultation bilaterally. Vascular: No carotid bruits. Neurological Exam: Mental status: alert and oriented to person, place, and time, recent and remote memory intact, fund of knowledge intact, attention  and concentration intact, speech fluent and not dysarthric, language intact. Cranial nerves: CN I: not tested CN II: pupils equal, round and reactive to light, visual fields intact CN III, IV, VI:  full range of  motion, no nystagmus, no ptosis CN V: facial sensation intact. CN VII: upper and lower face symmetric CN VIII: hearing intact CN IX, X: gag intact, uvula midline CN XI: sternocleidomastoid and trapezius muscles intact CN XII: tongue midline Bulk & Tone: normal, no fasciculations. Motor:  muscle strength 5/5 throughout Sensation:  Pinprick, temperature and vibratory sensation intact. Deep Tendon Reflexes:  2+ throughout,  toes downgoing.   Finger to nose testing:  Without dysmetria.   Heel to shin:  Without dysmetria.   Gait:  Normal station and stride.  Romberg negative.    Thank you for allowing me to take part in the care of this patient.  Shon Millet, DO  CC: ***

## 2021-03-19 ENCOUNTER — Ambulatory Visit: Payer: BC Managed Care – PPO | Admitting: Neurology

## 2021-03-19 ENCOUNTER — Encounter: Payer: Self-pay | Admitting: Neurology

## 2021-03-19 DIAGNOSIS — Z029 Encounter for administrative examinations, unspecified: Secondary | ICD-10-CM

## 2021-03-23 ENCOUNTER — Ambulatory Visit (HOSPITAL_BASED_OUTPATIENT_CLINIC_OR_DEPARTMENT_OTHER): Payer: BC Managed Care – PPO | Admitting: Obstetrics & Gynecology

## 2021-03-23 ENCOUNTER — Encounter (HOSPITAL_BASED_OUTPATIENT_CLINIC_OR_DEPARTMENT_OTHER): Payer: Self-pay

## 2021-05-02 ENCOUNTER — Ambulatory Visit (HOSPITAL_BASED_OUTPATIENT_CLINIC_OR_DEPARTMENT_OTHER): Payer: BC Managed Care – PPO | Admitting: Obstetrics & Gynecology

## 2021-05-07 ENCOUNTER — Ambulatory Visit (HOSPITAL_BASED_OUTPATIENT_CLINIC_OR_DEPARTMENT_OTHER): Payer: BC Managed Care – PPO | Admitting: Obstetrics & Gynecology

## 2021-06-25 ENCOUNTER — Ambulatory Visit (HOSPITAL_BASED_OUTPATIENT_CLINIC_OR_DEPARTMENT_OTHER): Payer: BC Managed Care – PPO | Admitting: Obstetrics & Gynecology

## 2021-07-10 ENCOUNTER — Other Ambulatory Visit: Payer: Self-pay

## 2021-07-10 ENCOUNTER — Encounter: Payer: Self-pay | Admitting: Obstetrics and Gynecology

## 2021-07-10 ENCOUNTER — Ambulatory Visit: Payer: BC Managed Care – PPO | Admitting: Obstetrics and Gynecology

## 2021-07-10 VITALS — BP 128/70 | HR 87 | Ht 63.0 in | Wt 130.0 lb

## 2021-07-10 DIAGNOSIS — B9689 Other specified bacterial agents as the cause of diseases classified elsewhere: Secondary | ICD-10-CM

## 2021-07-10 DIAGNOSIS — R109 Unspecified abdominal pain: Secondary | ICD-10-CM | POA: Diagnosis not present

## 2021-07-10 DIAGNOSIS — N760 Acute vaginitis: Secondary | ICD-10-CM

## 2021-07-10 DIAGNOSIS — R300 Dysuria: Secondary | ICD-10-CM

## 2021-07-10 LAB — WET PREP FOR TRICH, YEAST, CLUE

## 2021-07-10 MED ORDER — BETAMETHASONE VALERATE 0.1 % EX OINT: 1.0000 "application " | TOPICAL_OINTMENT | Freq: Two times a day (BID) | CUTANEOUS | 0 refills | Status: DC

## 2021-07-10 MED ORDER — METRONIDAZOLE 0.75 % VA GEL: 1.0000 | Freq: Every day | VAGINAL | 0 refills | Status: DC

## 2021-07-10 NOTE — Progress Notes (Signed)
GYNECOLOGY  VISIT   HPI: 20 y.o.   Single White or Caucasian Not Hispanic or Latino  female   [id number] with No LMP recorded.   here for  pain after urination  and vaginal discomfort present for about 3-4 weeks. Some discomfort when the urine hits the skin. No urinary frequency or urgency. Slight increase in thin vaginal discharge, + odor. No itching.   Mild, random lower abdominal discomfort for years. No diarrhea or constipation. She does c/o increased gas.   Last testing for STD's in 1/22, same partner. Doesn't feel she needs STD testing.  In the past she was given prophylactic antibiotics to take with intercourse. She was asking about that again. She has had one UTI in the last year that she doesn't think occurred from sex.   GYNECOLOGIC HISTORY: No LMP recorded. Contraception:OCP  Menopausal hormone therapy: none         OB History     Gravida  0   Para  0   Term  0   Preterm  0   AB  0   Living  0      SAB  0   IAB  0   Ectopic  0   Multiple  0   Live Births                 Patient Active Problem List   Diagnosis Date Noted   Recurrent UTI 03/08/2018   ADD (attention deficit disorder with hyperactivity) 03/31/2012    Past Medical History:  Diagnosis Date   ADHD (attention deficit hyperactivity disorder)    Asthma    Migraine with aura    auditroy aura    Past Surgical History:  Procedure Laterality Date   CRANIECTOMY FOR CRANIOSYNOSTOSIS  2013   done at [facility] TOOTH EXTRACTION      Current Outpatient Medications  Medication Sig Dispense Refill   ADDERALL XR 20 MG 24 hr capsule Take 20 mg by mouth at bedtime.     albuterol (VENTOLIN HFA) 108 (90 Base) MCG/ACT inhaler SMARTSIG:1 Puff(s) By Mouth Every 4 Hours PRN     nitrofurantoin, macrocrystal-monohydrate, (MACROBID) 100 MG capsule Take 1 capsule (100 mg total) by mouth 2 (two) times daily. 14 capsule 0   norethindrone (MICRONOR) 0.35 MG tablet Take 1 tablet (0.35 mg total) by  mouth daily. 84 tablet 4   No current facility-administered medications for this visit.     ALLERGIES: Almond (diagnostic), Apple, and Carrot [daucus carota]  Family History  Problem Relation Age of Onset   Diabetes Father        type II   Throat cancer Father     Social History   Socioeconomic History   Marital status: Single    Spouse name: Not on file   Number of children: Not on file   Years of education: Not on file   Highest education level: Not on file  Occupational History   Not on file  Tobacco Use   Smoking status: Never   Smokeless tobacco: Never  Substance and Sexual Activity   Alcohol use: No   Drug use: No   Sexual activity: Yes    Partners: Male    Birth control/protection: Condom, Pill  Other Topics Concern   Not on file  Social History Narrative   Not on file   Social Determinants of Health   Financial Resource Strain: Not on file  Food Insecurity: Not on file  Transportation Needs: Not on  file  Physical Activity: Not on file  Stress: Not on file  Social Connections: Not on file  Intimate Partner Violence: Not on file    Review of Systems  Genitourinary:  Positive for dysuria.   PHYSICAL EXAMINATION:    There were no vitals taken for this visit.    General appearance: alert, cooperative and appears stated age  Pelvic: External genitalia:  no lesions              Urethra:  normal appearing urethra with no masses, tenderness or lesions              Bartholins and Skenes: normal                 Vagina: slightly erythematous appearing vagina with an increase of both thin and thick vaginal discharge.              Cervix: no cervical motion tenderness and no lesions              Bimanual Exam:  Uterus:  normal size, contour, position, consistency, mobility, non-tender and anteverted              Adnexa: no mass, fullness, tenderness               Chaperone was present for exam.  1. Acute vaginitis - WET PREP FOR TRICH, YEAST, CLUE: +clue  and odor - betamethasone valerate ointment (VALISONE) 0.1 %; Apply 1 application topically 2 (two) times daily. Use prn irritation  Dispense: 15 g; Refill: 0  2. Dysuria Seems more external than internal - Urinalysis,Complete w/RFL Culture  3. Bacterial vaginitis - metroNIDAZOLE (METROGEL) 0.75 % vaginal gel; Place 1 Applicatorful vaginally at bedtime. Use qhs x 5 nights  Dispense: 70 g; Refill: 0  4. Abdominal discomfort Normal pelvic exam, doubt gyn source. Suspect GI -Can try gas-ex or beano

## 2021-07-10 NOTE — Patient Instructions (Signed)
Bacterial Vaginosis °Bacterial vaginosis is an infection that occurs when the normal balance of bacteria in the vagina changes. This change is caused by an overgrowth of certain bacteria in the vagina. Bacterial vaginosis is the most common vaginal infection among females aged 20 to 44 years. °This condition increases the risk of sexually transmitted infections (STIs). Treatment can help reduce this risk. Treatment is very important for pregnant women because this condition can cause babies to be born early (prematurely) or at a low birth weight. °What are the causes? °This condition is caused by an increase in harmful bacteria that are normally present in small amounts in the vagina. However, the exact reason this condition develops is not known. °You cannot get bacterial vaginosis from toilet seats, bedding, swimming pools, or contact with objects around you. °What increases the risk? °The following factors may make you more likely to develop this condition: °Having a new sexual partner or multiple sexual partners, or having unprotected sex. °Douching. °Having an intrauterine device (IUD). °Smoking. °Abusing drugs and alcohol. This may lead to riskier sexual behavior. °Taking certain antibiotic medicines. °Being pregnant. °What are the signs or symptoms? °Some women with this condition have no symptoms. Symptoms may include: °Gray or white vaginal discharge. The discharge can be watery or foamy. °A fish-like odor with discharge, especially after sex or during menstruation. °Itching in and around the vagina. °Burning or pain with urination. °How is this diagnosed? °This condition is diagnosed based on: °Your medical history. °A physical exam of the vagina. °Checking a sample of vaginal fluid for harmful bacteria or abnormal cells. °How is this treated? °This condition is treated with antibiotic medicines. These may be given as a pill, a vaginal cream, or a medicine that is put into the vagina (suppository). If the  condition comes back after treatment, a second round of antibiotics may be needed. °Follow these instructions at home: °Medicines °Take or apply over-the-counter and prescription medicines only as told by your health care provider. °Take or apply your antibiotic medicine as told by your health care provider. Do not stop using the antibiotic even if you start to feel better. °General instructions °If you have a female sexual partner, tell her that you have a vaginal infection. She should follow up with her health care provider. If you have a female sexual partner, he does not need treatment. °Avoid sexual activity until you finish treatment. °Drink enough fluid to keep your urine pale yellow. °Keep the area around your vagina and rectum clean. °Wash the area daily with warm water. °Wipe yourself from front to back after using the toilet. °If you are breastfeeding, talk to your health care provider about continuing breastfeeding during treatment. °Keep all follow-up visits. This is important. °How is this prevented? °Self-care °Do not douche. °Wash the outside of your vagina with warm water only. °Wear cotton or cotton-lined underwear. °Avoid wearing tight pants and pantyhose, especially during the summer. °Safe sex °Use protection when having sex. This includes: °Using condoms. °Using dental dams. This is a thin layer of a material made of latex or polyurethane that protects the mouth during oral sex. °Limit the number of sexual partners. To help prevent bacterial vaginosis, it is best to have sex with just one partner (monogamous relationship). °Make sure you and your sexual partner are tested for STIs. °Drugs and alcohol °Do not use any products that contain nicotine or tobacco. These products include cigarettes, chewing tobacco, and vaping devices, such as e-cigarettes. If you need help quitting,   ask your health care provider. °Do not use drugs. °Do not drink alcohol if: °Your health care provider tells you not to  do this. °You are pregnant, may be pregnant, or are planning to become pregnant. °If you drink alcohol: °Limit how much you have to 0-1 drink a day. °Be aware of how much alcohol is in your drink. In the [place] one drink equals one 12 oz bottle of beer (355 mL), one 5 oz glass of wine (148 mL), or one 1½ oz glass of hard liquor (44 mL). °Where to find more information °Centers for Disease Control and Prevention: www.cdc.gov °American Sexual Health Association (ASHA): www.ashastd.org °[place] Department of Health and Human Services, Office on Women's Health: www.womenshealth.gov °Contact a health care provider if: °Your symptoms do not improve, even after treatment. °You have more discharge or pain when urinating. °You have a fever or chills. °You have pain in your abdomen or pelvis. °You have pain during sex. °You have vaginal bleeding between menstrual periods. °Summary °Bacterial vaginosis is a vaginal infection that occurs when the normal balance of bacteria in the vagina changes. It results from an overgrowth of certain bacteria. °This condition increases the risk of sexually transmitted infections (STIs). Getting treated can help reduce this risk. °Treatment is very important for pregnant women because this condition can cause babies to be born early (prematurely) or at low birth weight. °This condition is treated with antibiotic medicines. These may be given as a pill, a vaginal cream, or a medicine that is put into the vagina (suppository). °This information is not intended to replace advice given to you by your health care provider. Make sure you discuss any questions you have with your health care provider. °Document Revised: 06/01/2020 Document Reviewed: 06/01/2020 °Elsevier Patient Education © 2022 [employer]vier Inc. ° °

## 2021-07-12 LAB — URINALYSIS, COMPLETE W/RFL CULTURE
Bilirubin Urine: NEGATIVE
Glucose, UA: NEGATIVE
Hyaline Cast: NONE SEEN /LPF
Ketones, ur: NEGATIVE
Leukocyte Esterase: NEGATIVE
Nitrites, Initial: NEGATIVE
Protein, ur: NEGATIVE
Specific Gravity, Urine: 1.02 (ref 1.001–1.035)
pH: 5.5 (ref 5.0–8.0)

## 2021-07-12 LAB — URINE CULTURE
MICRO NUMBER:: 12163803
SPECIMEN QUALITY:: ADEQUATE

## 2021-07-12 LAB — CULTURE INDICATED

## 2021-07-27 ENCOUNTER — Telehealth: Payer: Self-pay | Admitting: *Deleted

## 2021-07-27 NOTE — Telephone Encounter (Signed)
Patient called Triage with complaints of Urine odor,Dysuria,Urinary frequency. Request to be seen today. We don't have any openings today, discussed urgent care options. Pt states she would rather wait to see if we have an apt next week. I will send a message to [facility]-appointment pool for scheduling.

## 2021-08-01 ENCOUNTER — Ambulatory Visit: Payer: BC Managed Care – PPO | Admitting: Obstetrics and Gynecology

## 2021-08-01 ENCOUNTER — Other Ambulatory Visit: Payer: Self-pay

## 2021-08-01 ENCOUNTER — Encounter: Payer: Self-pay | Admitting: Obstetrics and Gynecology

## 2021-08-01 VITALS — BP 110/64 | HR 77 | Ht 63.0 in | Wt 133.0 lb

## 2021-08-01 DIAGNOSIS — N3090 Cystitis, unspecified without hematuria: Secondary | ICD-10-CM

## 2021-08-01 MED ORDER — NITROFURANTOIN MONOHYD MACRO 100 MG PO CAPS: 100.0000 mg | ORAL_CAPSULE | Freq: Two times a day (BID) | ORAL | 0 refills | Status: DC

## 2021-08-01 NOTE — Patient Instructions (Signed)
Urinary Tract Infection, Adult A urinary tract infection (UTI) is an infection of any part of the urinary tract. The urinary tract includes the kidneys, ureters, bladder, and urethra. These organs make, store, and get rid of urine in the body. An upper UTI affects the ureters and kidneys. A lower UTI affects the bladder and urethra. What are the causes? Most urinary tract infections are caused by bacteria in your genital area around your urethra, where urine leaves your body. These bacteria grow and cause inflammation of your urinary tract. What increases the risk? You are more likely to develop this condition if: You have a urinary catheter that stays in place. You are not able to control when you urinate or have a bowel movement (incontinence). You are female and you: Use a spermicide or diaphragm for birth control. Have low estrogen levels. Are pregnant. You have certain genes that increase your risk. You are sexually active. You take antibiotic medicines. You have a condition that causes your flow of urine to slow down, such as: An enlarged prostate, if you are female. Blockage in your urethra. A kidney stone. A nerve condition that affects your bladder control (neurogenic bladder). Not getting enough to drink, or not urinating often. You have certain medical conditions, such as: Diabetes. A weak disease-fighting system (immunesystem). Sickle cell disease. Gout. Spinal cord injury. What are the signs or symptoms? Symptoms of this condition include: Needing to urinate right away (urgency). Frequent urination. This may include small amounts of urine each time you urinate. Pain or burning with urination. Blood in the urine. Urine that smells bad or unusual. Trouble urinating. Cloudy urine. Vaginal discharge, if you are female. Pain in the abdomen or the lower back. You may also have: Vomiting or a decreased appetite. Confusion. Irritability or tiredness. A fever or  chills. Diarrhea. The first symptom in older adults may be confusion. In some cases, they may not have any symptoms until the infection has worsened. How is this diagnosed? This condition is diagnosed based on your medical history and a physical exam. You may also have other tests, including: Urine tests. Blood tests. Tests for STIs (sexually transmitted infections). If you have had more than one UTI, a cystoscopy or imaging studies may be done to determine the cause of the infections. How is this treated? Treatment for this condition includes: Antibiotic medicine. Over-the-counter medicines to treat discomfort. Drinking enough water to stay hydrated. If you have frequent infections or have other conditions such as a kidney stone, you may need to see a health care provider who specializes in the urinary tract (urologist). In rare cases, urinary tract infections can cause sepsis. Sepsis is a life-threatening condition that occurs when the body responds to an infection. Sepsis is treated in the hospital with IV antibiotics, fluids, and other medicines. Follow these instructions at home: Medicines Take over-the-counter and prescription medicines only as told by your health care provider. If you were prescribed an antibiotic medicine, take it as told by your health care provider. Do not stop using the antibiotic even if you start to feel better. General instructions Make sure you: Empty your bladder often and completely. Do not hold urine for long periods of time. Empty your bladder after sex. Wipe from front to back after urinating or having a bowel movement if you are female. Use each tissue only one time when you wipe. Drink enough fluid to keep your urine pale yellow. Keep all follow-up visits. This is important. Contact a health care provider   if: Your symptoms do not get better after 1-2 days. Your symptoms go away and then return. Get help right away if: You have severe pain in your  back or your lower abdomen. You have a fever or chills. You have nausea or vomiting. Summary A urinary tract infection (UTI) is an infection of any part of the urinary tract, which includes the kidneys, ureters, bladder, and urethra. Most urinary tract infections are caused by bacteria in your genital area. Treatment for this condition often includes antibiotic medicines. If you were prescribed an antibiotic medicine, take it as told by your health care provider. Do not stop using the antibiotic even if you start to feel better. Keep all follow-up visits. This is important. This information is not intended to replace advice given to you by your health care provider. Make sure you discuss any questions you have with your health care provider. Document Revised: 07/14/2020 Document Reviewed: 07/14/2020 Elsevier Patient Education  2022 Elsevier Inc.  

## 2021-08-01 NOTE — Progress Notes (Signed)
GYNECOLOGY  VISIT   HPI: 20 y.o.   Single White or Caucasian Not Hispanic or Latino  female   G0P0000 with No LMP recorded.   here for patient states that her urine has a strange odor and pain after urination.  She was treated for BV at the end of July.  Current symptoms started about a week ago., after she finished her metrogel. She has a strong odor to her urine, it it to the point that it is embarassing. No urinary urgency or frequency to urinate. She has some discomfort, itchy sensation at her urethra after voiding. She reports that this is her typical presentation with a UTI. No flank pain, no abdominal pain, no fever She is having frequent sex and thinks this is the cause. She always voids after intercourse and wipes front to back. Hydrates well. She is wondering about antibiotic suppression with intercourse.  She had one documented UTI in the last year, in 1/22.   She has a h/o microscopic hematuria, has seen a Insurance underwriter and had a negative evaluation a year or two ago.   GYNECOLOGIC HISTORY: No LMP recorded. Contraception:OCP  Menopausal hormone therapy: none         OB History     Gravida  0   Para  0   Term  0   Preterm  0   AB  0   Living  0      SAB  0   IAB  0   Ectopic  0   Multiple  0   Live Births                 Patient Active Problem List   Diagnosis Date Noted   Recurrent UTI 03/08/2018   ADD (attention deficit disorder with hyperactivity) 03/31/2012    Past Medical History:  Diagnosis Date   ADHD (attention deficit hyperactivity disorder)    Asthma    Migraine with aura    auditroy aura    Past Surgical History:  Procedure Laterality Date   CRANIECTOMY FOR CRANIOSYNOSTOSIS  2013   done at [facility] TOOTH EXTRACTION      Current Outpatient Medications  Medication Sig Dispense Refill   ADDERALL XR 20 MG 24 hr capsule Take 20 mg by mouth at bedtime.     albuterol (VENTOLIN HFA) 108 (90 Base) MCG/ACT inhaler SMARTSIG:1  Puff(s) By Mouth Every 4 Hours PRN     betamethasone valerate ointment (VALISONE) 0.1 % Apply 1 application topically 2 (two) times daily. Use prn irritation 15 g 0   metroNIDAZOLE (METROGEL) 0.75 % vaginal gel Place 1 Applicatorful vaginally at bedtime. Use qhs x 5 nights 70 g 0   norethindrone (MICRONOR) 0.35 MG tablet Take 1 tablet (0.35 mg total) by mouth daily. 84 tablet 4   No current facility-administered medications for this visit.     ALLERGIES: Almond (diagnostic), Apple, and Carrot [daucus carota]  Family History  Problem Relation Age of Onset   Diabetes Father        type II   Throat cancer Father     Social History   Socioeconomic History   Marital status: Single    Spouse name: Not on file   Number of children: Not on file   Years of education: Not on file   Highest education level: Not on file  Occupational History   Not on file  Tobacco Use   Smoking status: Never   Smokeless tobacco: Never  Substance and  Sexual Activity   Alcohol use: No   Drug use: No   Sexual activity: Yes    Partners: Male    Birth control/protection: Condom, Pill  Other Topics Concern   Not on file  Social History Narrative   Not on file   Social Determinants of Health   Financial Resource Strain: Not on file  Food Insecurity: Not on file  Transportation Needs: Not on file  Physical Activity: Not on file  Stress: Not on file  Social Connections: Not on file  Intimate Partner Violence: Not on file    Review of Systems  All other systems reviewed and are negative.  PHYSICAL EXAMINATION:    There were no vitals taken for this visit.    General appearance: alert, cooperative and appears stated age CVA: not tender Abdomen: soft, non-tender; non distended, no masses,  no organomegaly  Urine dip: 0-5 WBC, 20-40 RBC, moderate bacteria, spg 1.023, negative nitrates.  (She is not on her cycle)  1. Cystitis Suspected cystitis. Same as typical presentation. - nitrofurantoin,  macrocrystal-monohydrate, (MACROBID) 100 MG capsule; Take 1 capsule (100 mg total) by mouth 2 (two) times daily.  Dispense: 10 capsule; Refill: 0 - Urinalysis,Complete w/RFL Culture -Would not put her on antibiotic suppression with intercourse yet (would treat if she has 2 infections in 6 months or 3 in a year)

## 2021-08-04 LAB — URINALYSIS, COMPLETE W/RFL CULTURE
Bilirubin Urine: NEGATIVE
Glucose, UA: NEGATIVE
Hyaline Cast: NONE SEEN /LPF
Ketones, ur: NEGATIVE
Leukocyte Esterase: NEGATIVE
Nitrites, Initial: NEGATIVE
Protein, ur: NEGATIVE
Specific Gravity, Urine: 1.023 (ref 1.001–1.035)
pH: 5.5 (ref 5.0–8.0)

## 2021-08-04 LAB — URINE CULTURE
MICRO NUMBER:: 12254845
SPECIMEN QUALITY:: ADEQUATE

## 2021-08-04 LAB — CULTURE INDICATED

## 2021-08-08 ENCOUNTER — Other Ambulatory Visit: Payer: Self-pay | Admitting: *Deleted

## 2021-08-08 MED ORDER — SULFAMETHOXAZOLE-TRIMETHOPRIM 800-160 MG PO TABS
1.0000 | ORAL_TABLET | Freq: Two times a day (BID) | ORAL | 0 refills | Status: DC
Start: 2021-08-08 — End: 2021-10-09

## 2021-10-09 ENCOUNTER — Other Ambulatory Visit: Payer: Self-pay

## 2021-10-09 ENCOUNTER — Encounter: Payer: Self-pay | Admitting: Obstetrics and Gynecology

## 2021-10-09 ENCOUNTER — Ambulatory Visit: Payer: BC Managed Care – PPO | Admitting: Obstetrics and Gynecology

## 2021-10-09 VITALS — BP 122/70 | HR 66 | Ht 63.0 in | Wt 131.8 lb

## 2021-10-09 DIAGNOSIS — Z3041 Encounter for surveillance of contraceptive pills: Secondary | ICD-10-CM

## 2021-10-09 DIAGNOSIS — N3090 Cystitis, unspecified without hematuria: Secondary | ICD-10-CM | POA: Diagnosis not present

## 2021-10-09 MED ORDER — NORETHINDRONE 0.35 MG PO TABS: 1.0000 | ORAL_TABLET | Freq: Every day | ORAL | 0 refills | Status: DC

## 2021-10-09 MED ORDER — PHENAZOPYRIDINE HCL 200 MG PO TABS: 200.0000 mg | ORAL_TABLET | Freq: Three times a day (TID) | ORAL | 0 refills | Status: DC

## 2021-10-09 MED ORDER — SULFAMETHOXAZOLE-TRIMETHOPRIM 800-160 MG PO TABS: 1.0000 | ORAL_TABLET | Freq: Two times a day (BID) | ORAL | 0 refills | Status: DC

## 2021-10-09 NOTE — Patient Instructions (Signed)
Urinary Tract Infection, Adult A urinary tract infection (UTI) is an infection of any part of the urinary tract. The urinary tract includes the kidneys, ureters, bladder, and urethra. These organs make, store, and get rid of urine in the body. An upper UTI affects the ureters and kidneys. A lower UTI affects the bladder and urethra. What are the causes? Most urinary tract infections are caused by bacteria in your genital area around your urethra, where urine leaves your body. These bacteria grow and cause inflammation of your urinary tract. What increases the risk? You are more likely to develop this condition if: You have a urinary catheter that stays in place. You are not able to control when you urinate or have a bowel movement (incontinence). You are female and you: Use a spermicide or diaphragm for birth control. Have low estrogen levels. Are pregnant. You have certain genes that increase your risk. You are sexually active. You take antibiotic medicines. You have a condition that causes your flow of urine to slow down, such as: An enlarged prostate, if you are female. Blockage in your urethra. A kidney stone. A nerve condition that affects your bladder control (neurogenic bladder). Not getting enough to drink, or not urinating often. You have certain medical conditions, such as: Diabetes. A weak disease-fighting system (immunesystem). Sickle cell disease. Gout. Spinal cord injury. What are the signs or symptoms? Symptoms of this condition include: Needing to urinate right away (urgency). Frequent urination. This may include small amounts of urine each time you urinate. Pain or burning with urination. Blood in the urine. Urine that smells bad or unusual. Trouble urinating. Cloudy urine. Vaginal discharge, if you are female. Pain in the abdomen or the lower back. You may also have: Vomiting or a decreased appetite. Confusion. Irritability or tiredness. A fever or  chills. Diarrhea. The first symptom in older adults may be confusion. In some cases, they may not have any symptoms until the infection has worsened. How is this diagnosed? This condition is diagnosed based on your medical history and a physical exam. You may also have other tests, including: Urine tests. Blood tests. Tests for STIs (sexually transmitted infections). If you have had more than one UTI, a cystoscopy or imaging studies may be done to determine the cause of the infections. How is this treated? Treatment for this condition includes: Antibiotic medicine. Over-the-counter medicines to treat discomfort. Drinking enough water to stay hydrated. If you have frequent infections or have other conditions such as a kidney stone, you may need to see a health care provider who specializes in the urinary tract (urologist). In rare cases, urinary tract infections can cause sepsis. Sepsis is a life-threatening condition that occurs when the body responds to an infection. Sepsis is treated in the hospital with IV antibiotics, fluids, and other medicines. Follow these instructions at home: Medicines Take over-the-counter and prescription medicines only as told by your health care provider. If you were prescribed an antibiotic medicine, take it as told by your health care provider. Do not stop using the antibiotic even if you start to feel better. General instructions Make sure you: Empty your bladder often and completely. Do not hold urine for long periods of time. Empty your bladder after sex. Wipe from front to back after urinating or having a bowel movement if you are female. Use each tissue only one time when you wipe. Drink enough fluid to keep your urine pale yellow. Keep all follow-up visits. This is important. Contact a health care provider   if: Your symptoms do not get better after 1-2 days. Your symptoms go away and then return. Get help right away if: You have severe pain in your  back or your lower abdomen. You have a fever or chills. You have nausea or vomiting. Summary A urinary tract infection (UTI) is an infection of any part of the urinary tract, which includes the kidneys, ureters, bladder, and urethra. Most urinary tract infections are caused by bacteria in your genital area. Treatment for this condition often includes antibiotic medicines. If you were prescribed an antibiotic medicine, take it as told by your health care provider. Do not stop using the antibiotic even if you start to feel better. Keep all follow-up visits. This is important. This information is not intended to replace advice given to you by your health care provider. Make sure you discuss any questions you have with your health care provider. Document Revised: 07/14/2020 Document Reviewed: 07/14/2020 Elsevier Patient Education  2022 Elsevier Inc.  

## 2021-10-09 NOTE — Progress Notes (Signed)
GYNECOLOGY  VISIT   HPI: 20 y.o.   Single White or Caucasian Not Hispanic or Latino  female   G0P0000 with No LMP recorded.   here for dysuria and urine odor. She feels like she gets these symptoms after sex.   Symptoms started 1-2 weeks ago. Initially she had urinary urgency, dysuria and an odor. She took azo for 2 days and it helped some. Currently has an odor to her urine, mild dysuria.  No flank pain, no fevers.  She just finished her cycle yesterday.  She had a documented UTI in 1/22 and in 8/22.   No vaginal c/o. No STD concerns. She is sexually active, same partner for almost a year.   GYNECOLOGIC HISTORY: No LMP recorded. Contraception:OCP Menopausal hormone therapy: none         OB History     Gravida  0   Para  0   Term  0   Preterm  0   AB  0   Living  0      SAB  0   IAB  0   Ectopic  0   Multiple  0   Live Births                 Patient Active Problem List   Diagnosis Date Noted   Recurrent UTI 03/08/2018   ADD (attention deficit disorder with hyperactivity) 03/31/2012    Past Medical History:  Diagnosis Date   ADHD (attention deficit hyperactivity disorder)    Asthma    Migraine with aura    auditroy aura    Past Surgical History:  Procedure Laterality Date   CRANIECTOMY FOR CRANIOSYNOSTOSIS  2013   done at [facility] TOOTH EXTRACTION      Current Outpatient Medications  Medication Sig Dispense Refill   ADDERALL XR 20 MG 24 hr capsule Take 20 mg by mouth at bedtime.     albuterol (VENTOLIN HFA) 108 (90 Base) MCG/ACT inhaler SMARTSIG:1 Puff(s) By Mouth Every 4 Hours PRN     betamethasone valerate ointment (VALISONE) 0.1 % Apply 1 application topically 2 (two) times daily. Use prn irritation 15 g 0   metroNIDAZOLE (METROGEL) 0.75 % vaginal gel Place 1 Applicatorful vaginally at bedtime. Use qhs x 5 nights 70 g 0   norethindrone (MICRONOR) 0.35 MG tablet Take 1 tablet (0.35 mg total) by mouth daily. 84 tablet 4    sulfamethoxazole-trimethoprim (BACTRIM DS) 800-160 MG tablet Take 1 tablet by mouth 2 (two) times daily. 6 tablet 0   No current facility-administered medications for this visit.     ALLERGIES: Almond (diagnostic), Apple, and Carrot [daucus carota]  Family History  Problem Relation Age of Onset   Diabetes Father        type II   Throat cancer Father     Social History   Socioeconomic History   Marital status: Single    Spouse name: Not on file   Number of children: Not on file   Years of education: Not on file   Highest education level: Not on file  Occupational History   Not on file  Tobacco Use   Smoking status: Never   Smokeless tobacco: Never  Substance and Sexual Activity   Alcohol use: No   Drug use: No   Sexual activity: Yes    Partners: Male    Birth control/protection: Condom, Pill  Other Topics Concern   Not on file  Social History Narrative   Not on file  Social Determinants of Health   Financial Resource Strain: Not on file  Food Insecurity: Not on file  Transportation Needs: Not on file  Physical Activity: Not on file  Stress: Not on file  Social Connections: Not on file  Intimate Partner Violence: Not on file    Review of Systems  Genitourinary:  Positive for dysuria.       Urin odor   PHYSICAL EXAMINATION:    BP 122/70   Pulse 66   Ht 5\' 3"  (1.6 m)   Wt 131 lb 12.8 oz (59.8 kg)   SpO2 100%   BMI 23.35 kg/m     General appearance: alert, cooperative and appears stated age Abdomen: soft, mildly tender in her mid upper abdomen; non distended, no masses,  no organomegaly CVA: not tender  1. Cystitis - Urinalysis,Complete w/RFL Culture - sulfamethoxazole-trimethoprim (BACTRIM DS) 800-160 MG tablet; Take 1 tablet by mouth 2 (two) times daily.  Dispense: 6 tablet; Refill: 0 - phenazopyridine (PYRIDIUM) 200 MG tablet; Take 1 tablet (200 mg total) by mouth 3 (three) times daily with meals.  Dispense: 6 tablet; Refill: 0 -If her culture is  +, will give her a script for macrodantin to take when she is sexually active.   2. Surveillance of previously prescribed contraceptive pill Has an annual exam in 1/23 - norethindrone (MICRONOR) 0.35 MG tablet; Take 1 tablet (0.35 mg total) by mouth daily.  Dispense: 84 tablet; Refill: 0

## 2021-10-11 ENCOUNTER — Other Ambulatory Visit: Payer: Self-pay | Admitting: Obstetrics and Gynecology

## 2021-10-11 DIAGNOSIS — R3129 Other microscopic hematuria: Secondary | ICD-10-CM

## 2021-10-11 LAB — URINALYSIS, COMPLETE W/RFL CULTURE
Bilirubin Urine: NEGATIVE
Glucose, UA: NEGATIVE
Hyaline Cast: NONE SEEN /LPF
Ketones, ur: NEGATIVE
Leukocyte Esterase: NEGATIVE
Nitrites, Initial: NEGATIVE
Protein, ur: NEGATIVE
Specific Gravity, Urine: 1.02 (ref 1.001–1.035)
pH: 7 (ref 5.0–8.0)

## 2021-10-11 LAB — URINE CULTURE
MICRO NUMBER:: 12548056
SPECIMEN QUALITY:: ADEQUATE

## 2021-10-11 LAB — CULTURE INDICATED

## 2021-12-06 ENCOUNTER — Telehealth: Payer: Self-pay | Admitting: Obstetrics and Gynecology

## 2021-12-06 NOTE — Telephone Encounter (Signed)
Mychart message sent advising the patient to return for a repeat urinalysis

## 2021-12-16 IMAGING — US US RENAL
1 series · 14 of 25 positions shown · non-contrast
Comparison: None.

CLINICAL DATA: Microscopic hematuria

EXAM:
RENAL / URINARY TRACT ULTRASOUND COMPLETE

[Series 1: us renal · 0.22mm/px · 14 of 32 slices shown]
[im 1/32]
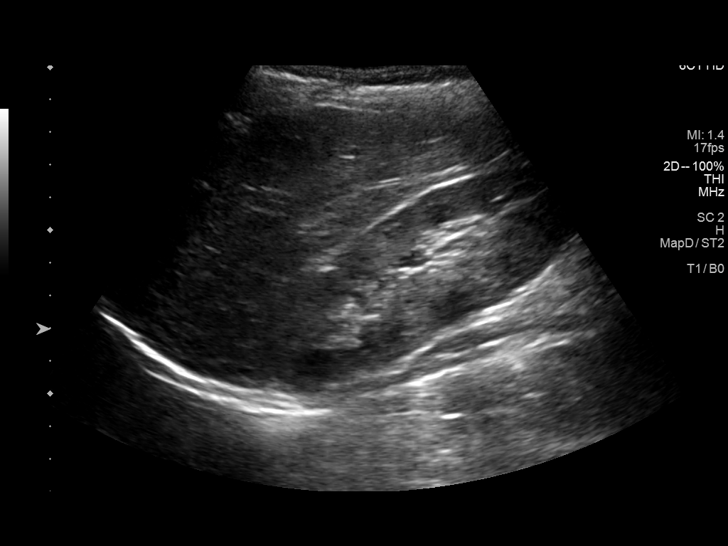
[im 3/32]
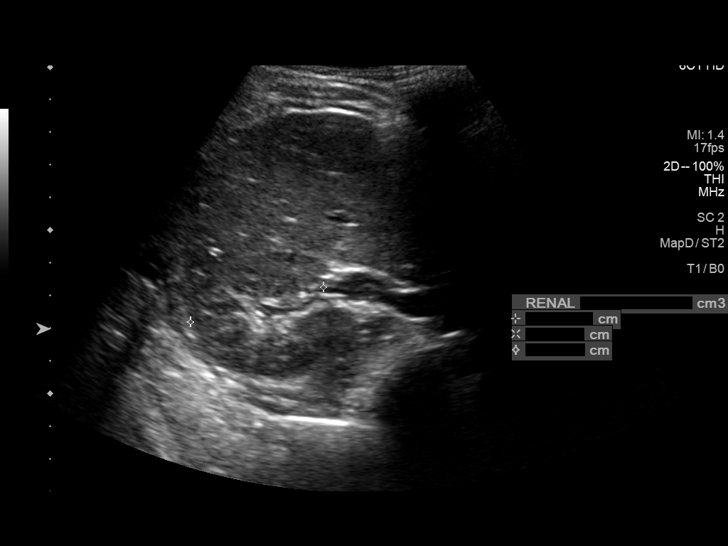
[im 6/32]
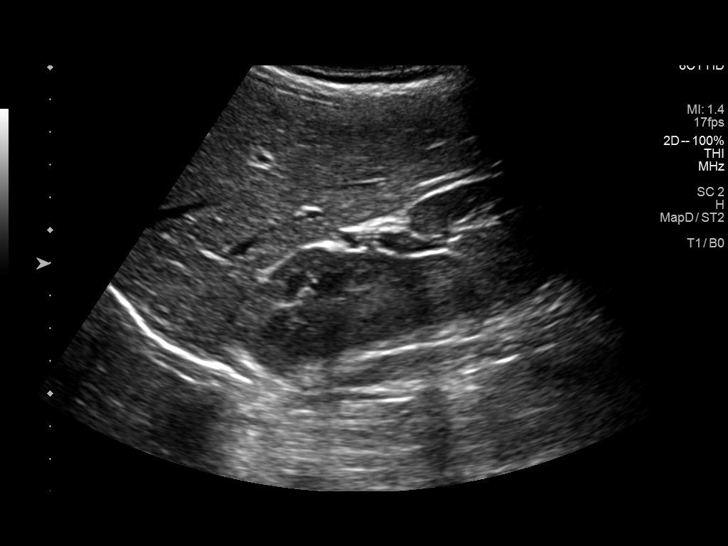
[im 8/32]
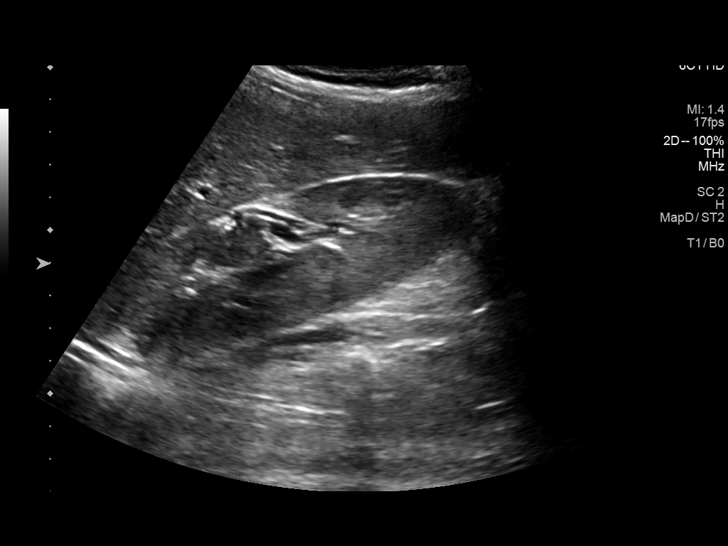
[im 11/32]
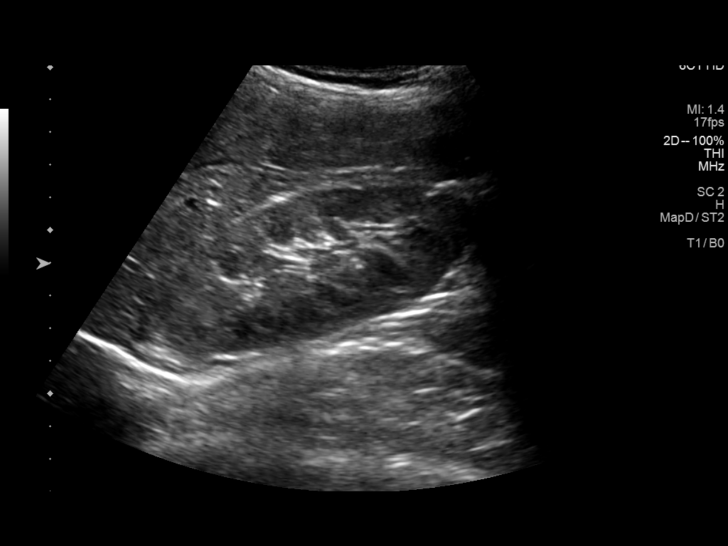
[im 12/32]
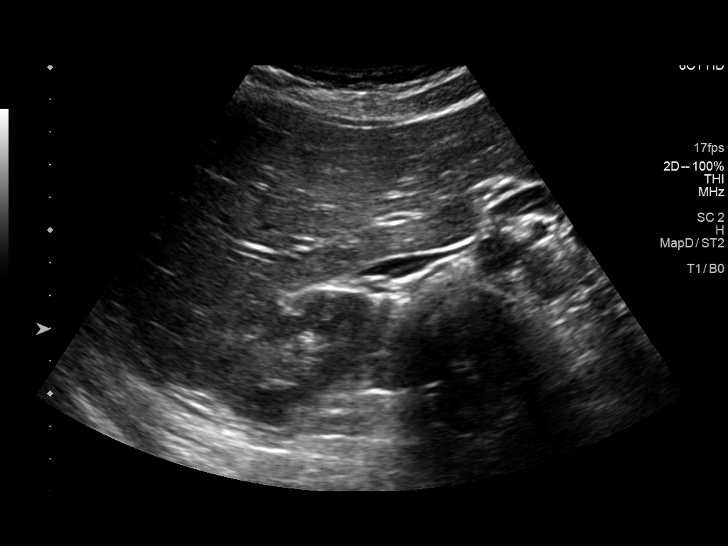
[im 15/32]
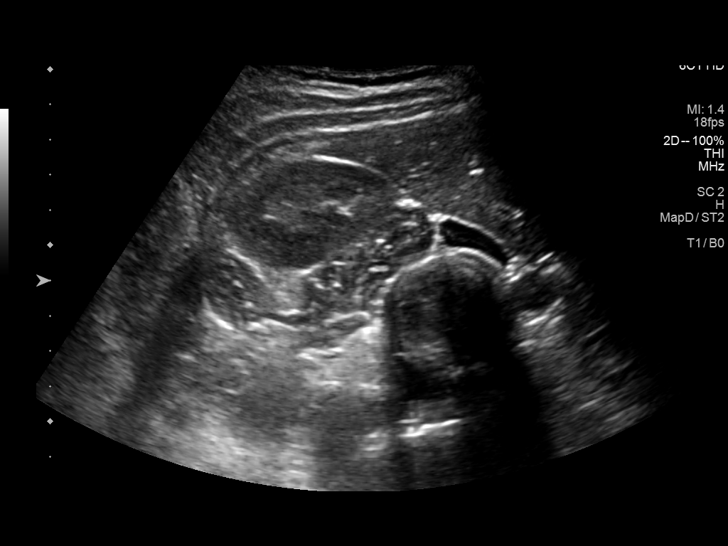
[im 17/32]
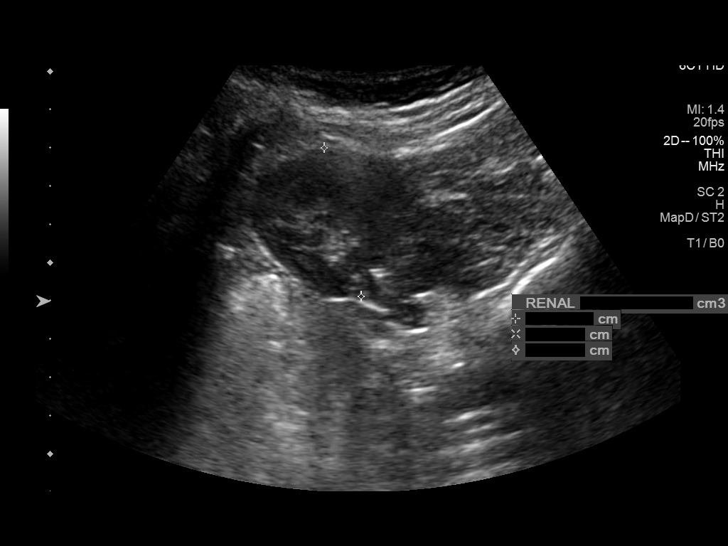
[im 20/32]
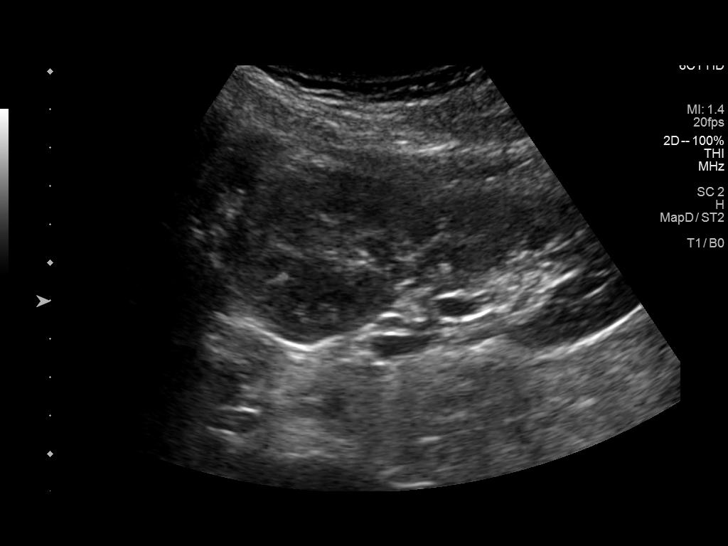
[im 21/32]
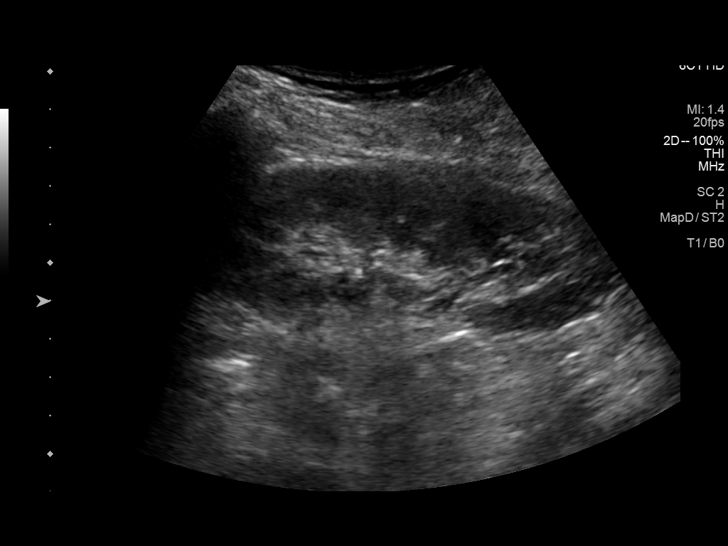
[im 24/32]
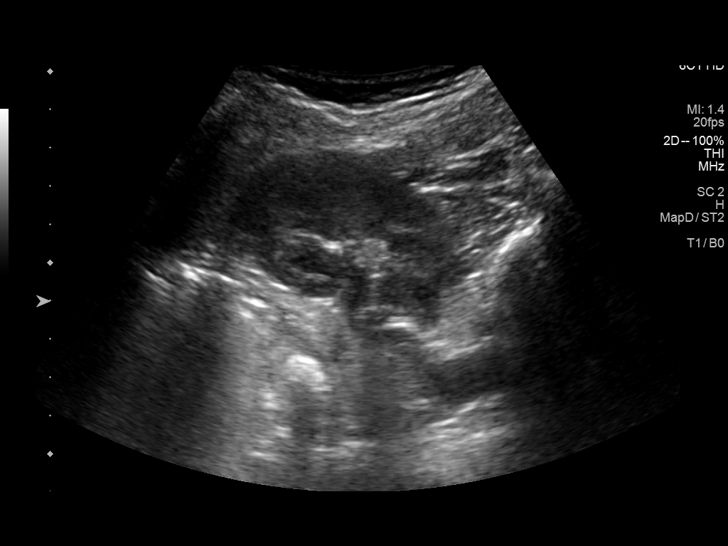
[im 26/32]
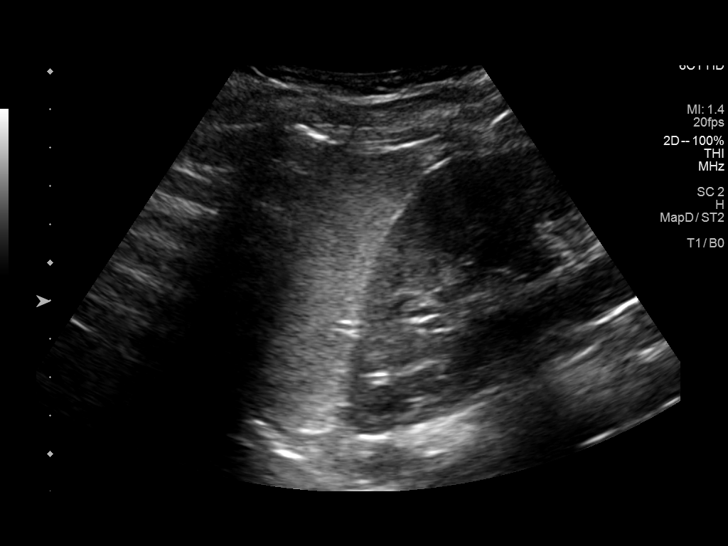
[im 29/32]
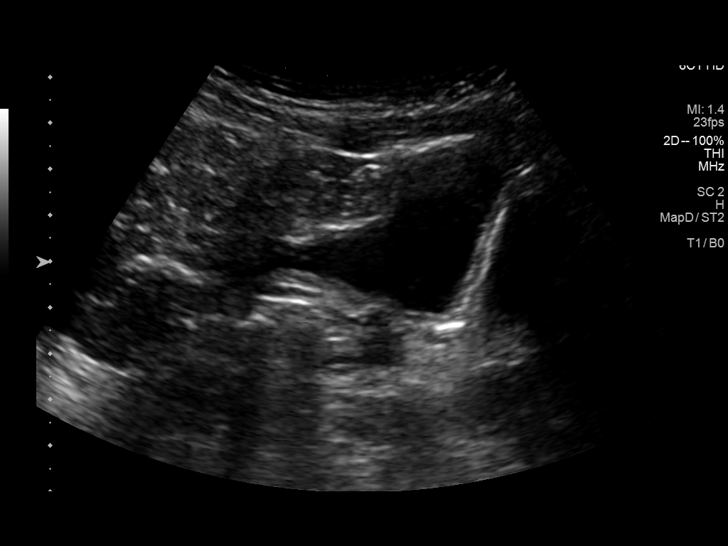
[im 32/32]
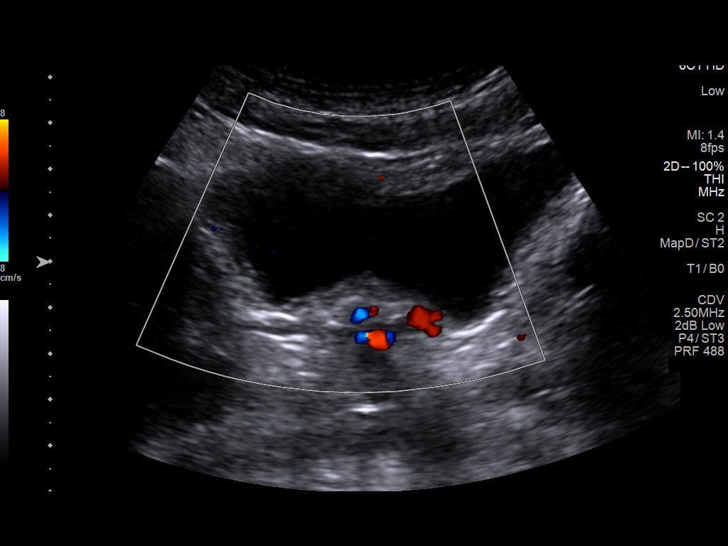

[14 of 25 positions shown; findings below may reference images not displayed]

FINDINGS: Right Kidney:

Renal measurements: 11.4 x 4.1 x 4.2 cm = volume: 102 mL.
Echogenicity within normal limits. No mass or hydronephrosis
visualized.

Left Kidney:

Renal measurements: 11.8 x 4.6 x 4 cm = volume: 114 mL. Echogenicity
within normal limits. No mass or hydronephrosis visualized.

Bladder:

Appears normal for degree of bladder distention.

Other:

None.
IMPRESSION: Negative renal ultrasound

## 2021-12-19 ENCOUNTER — Other Ambulatory Visit: Payer: Self-pay | Admitting: Obstetrics and Gynecology

## 2021-12-19 DIAGNOSIS — Z3041 Encounter for surveillance of contraceptive pills: Secondary | ICD-10-CM

## 2021-12-20 MED ORDER — NORETHINDRONE 0.35 MG PO TABS: 1.0000 | ORAL_TABLET | Freq: Every day | ORAL | 0 refills | Status: DC

## 2021-12-20 NOTE — Telephone Encounter (Signed)
Annual exam scheduled on 01/10/22 Last annual exam 01/09/21

## 2021-12-21 ENCOUNTER — Other Ambulatory Visit: Payer: Self-pay | Admitting: Obstetrics and Gynecology

## 2021-12-21 DIAGNOSIS — Z3041 Encounter for surveillance of contraceptive pills: Secondary | ICD-10-CM

## 2022-01-08 NOTE — Progress Notes (Signed)
20 y.o. G0P0000 Single White or Caucasian Not Hispanic or Latino female here for annual exam.  She is on micronor.  Period Cycle (Days): 28 Period Duration (Days): 7 Period Pattern: Regular Menstrual Flow: Moderate Menstrual Control: Tampon Menstrual Control Change Freq (Hours): 4 Dysmenorrhea: (!) Moderate Dysmenorrhea Symptoms: Cramping, Headache Sexually active, same partner x 15 months. Not using condoms. No pain with intercourse.   She has migraines with aura's. They have been better since she has been on POP.  She has had occasional elevated BP's.   Patient's last menstrual period was 12/24/2021.          Sexually active: Yes.    The current method of family planning is oral progesterone-only contraceptive.    Exercising: Yes.     Walking the dog  Smoker:  no  Health Maintenance: Pap:  none History of abnormal Pap:  no MMG:  none Colonoscopy:  none BMD:   none TDaP:  2012 Gardasil:  completed    reports that she has never smoked. She has never used smokeless tobacco. She reports that she does not drink alcohol and does not use drugs. 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 She is second year at [employer], studying nursing (BSN).   Past Medical History:  Diagnosis Date   ADHD (attention deficit hyperactivity disorder)    Asthma    Migraine with aura    auditroy aura    Past Surgical History:  Procedure Laterality Date   CRANIECTOMY FOR CRANIOSYNOSTOSIS  2013   done at [facility] TOOTH EXTRACTION      Current Outpatient Medications  Medication Sig Dispense Refill   ADDERALL XR 20 MG 24 hr capsule Take 20 mg by mouth at bedtime.     albuterol (VENTOLIN HFA) 108 (90 Base) MCG/ACT inhaler SMARTSIG:1 Puff(s) By Mouth Every 4 Hours PRN     betamethasone valerate ointment (VALISONE) 0.1 % Apply 1 application topically 2 (two) times daily. Use prn irritation 15 g 0   norethindrone (MICRONOR) 0.35 MG tablet Take 1 tablet (0.35 mg total) by mouth daily. 84 tablet 0   No  current facility-administered medications for this visit.    Family History  Problem Relation Age of Onset   Diabetes Father        type II   Throat cancer Father     Review of Systems  All other systems reviewed and are negative.  Exam:   BP 110/76    Pulse 74    Ht 5\' 3"  (1.6 m)    Wt 135 lb (61.2 kg)    LMP 12/24/2021    SpO2 100%    BMI 23.91 kg/m   Weight change: @WEIGHTCHANGE @ Height:   Height: 5\' 3"  (160 cm)  Ht Readings from Last 3 Encounters:  01/10/22 5\' 3"  (1.6 m)  10/09/21 5\' 3"  (1.6 m)  08/01/21 5\' 3"  (1.6 m)    General appearance: alert, cooperative and appears stated age Head: Normocephalic, without obvious abnormality, atraumatic Neck: no adenopathy, supple, symmetrical, trachea midline and thyroid normal to inspection and palpation Lungs: clear to auscultation bilaterally Cardiovascular: regular rate and rhythm Breasts: normal appearance, no masses or tenderness Abdomen: soft, non-tender; non distended,  no masses,  no organomegaly Extremities: extremities normal, atraumatic, no cyanosis or edema Skin: Skin color, texture, turgor normal. No rashes or lesions Lymph nodes: Cervical, supraclavicular, and axillary nodes normal. No abnormal inguinal nodes palpated Neurologic: Grossly normal     1. Well woman exam Discussed  breast self exam Discussed calcium and vit D intake Pap next year   2. Screening examination for STD (sexually transmitted disease) - SURESWAB CT/NG/T. vaginalis - RPR - HIV Antibody (routine testing w rflx) - HSV(herpes simplex vrs) 1+2 ab-IgG  3. Surveillance of previously prescribed contraceptive pill Doing well - norethindrone (MICRONOR) 0.35 MG tablet; Take 1 tablet (0.35 mg total) by mouth daily.  Dispense: 84 tablet; Refill: 3  4. Immunization due - Tdap vaccine greater than or equal to 7yo IM  5. Screening examination for infectious disease - HIV Antibody (routine testing w rflx)

## 2022-01-10 ENCOUNTER — Encounter: Payer: Self-pay | Admitting: Obstetrics and Gynecology

## 2022-01-10 ENCOUNTER — Ambulatory Visit (INDEPENDENT_AMBULATORY_CARE_PROVIDER_SITE_OTHER): Payer: BC Managed Care – PPO | Admitting: Obstetrics and Gynecology

## 2022-01-10 ENCOUNTER — Other Ambulatory Visit: Payer: Self-pay

## 2022-01-10 VITALS — BP 110/76 | HR 74 | Ht 63.0 in | Wt 135.0 lb

## 2022-01-10 DIAGNOSIS — Z23 Encounter for immunization: Secondary | ICD-10-CM

## 2022-01-10 DIAGNOSIS — Z01419 Encounter for gynecological examination (general) (routine) without abnormal findings: Secondary | ICD-10-CM | POA: Diagnosis not present

## 2022-01-10 DIAGNOSIS — F909 Attention-deficit hyperactivity disorder, unspecified type: Secondary | ICD-10-CM | POA: Insufficient documentation

## 2022-01-10 DIAGNOSIS — Z3041 Encounter for surveillance of contraceptive pills: Secondary | ICD-10-CM | POA: Diagnosis not present

## 2022-01-10 DIAGNOSIS — Z113 Encounter for screening for infections with a predominantly sexual mode of transmission: Secondary | ICD-10-CM | POA: Diagnosis not present

## 2022-01-10 DIAGNOSIS — Z119 Encounter for screening for infectious and parasitic diseases, unspecified: Secondary | ICD-10-CM

## 2022-01-10 DIAGNOSIS — J4520 Mild intermittent asthma, uncomplicated: Secondary | ICD-10-CM | POA: Insufficient documentation

## 2022-01-10 DIAGNOSIS — F419 Anxiety disorder, unspecified: Secondary | ICD-10-CM | POA: Insufficient documentation

## 2022-01-10 MED ORDER — NORETHINDRONE 0.35 MG PO TABS: 1.0000 | ORAL_TABLET | Freq: Every day | ORAL | 3 refills | Status: DC

## 2022-01-10 NOTE — Patient Instructions (Signed)

## 2022-01-11 LAB — SURESWAB CT/NG/T. VAGINALIS
C. trachomatis RNA, TMA: NOT DETECTED
N. gonorrhoeae RNA, TMA: NOT DETECTED
Trichomonas vaginalis RNA: NOT DETECTED

## 2022-01-22 ENCOUNTER — Ambulatory Visit: Payer: BC Managed Care – PPO | Admitting: Obstetrics and Gynecology

## 2022-01-22 ENCOUNTER — Encounter: Payer: Self-pay | Admitting: Obstetrics and Gynecology

## 2022-01-22 ENCOUNTER — Other Ambulatory Visit: Payer: Self-pay

## 2022-01-22 VITALS — BP 110/70 | HR 72 | Ht 63.0 in | Wt 136.0 lb

## 2022-01-22 DIAGNOSIS — Z708 Other sex counseling: Secondary | ICD-10-CM | POA: Diagnosis not present

## 2022-01-22 DIAGNOSIS — Z113 Encounter for screening for infections with a predominantly sexual mode of transmission: Secondary | ICD-10-CM | POA: Diagnosis not present

## 2022-01-22 NOTE — Progress Notes (Signed)
GYNECOLOGY  VISIT   HPI: 21 y.o.   Single White or Caucasian Not Hispanic or Latino  female   G0P0000 with Patient's last menstrual period was 12/24/2021.   here to discuss lab results.  Recent STD testing was + for HSV 2, low + level, confirmatory testing is pending. No h/o oral or genital hsv.   GYNECOLOGIC HISTORY: Patient's last menstrual period was 12/24/2021. Contraception:OCP Menopausal hormone therapy: none         OB History     Gravida  0   Para  0   Term  0   Preterm  0   AB  0   Living  0      SAB  0   IAB  0   Ectopic  0   Multiple  0   Live Births                 Patient Active Problem List   Diagnosis Date Noted   Anxiety 01/10/2022   Mild intermittent asthma 01/10/2022   Attention deficit hyperactivity disorder 01/10/2022   Recurrent UTI 03/08/2018   ADD (attention deficit disorder with hyperactivity) 03/31/2012    Past Medical History:  Diagnosis Date   ADHD (attention deficit hyperactivity disorder)    Asthma    Migraine with aura    auditroy aura    Past Surgical History:  Procedure Laterality Date   CRANIECTOMY FOR CRANIOSYNOSTOSIS  2013   done at [place] EXTRACTION      Current Outpatient Medications  Medication Sig Dispense Refill   ADDERALL XR 20 MG 24 hr capsule Take 20 mg by mouth at bedtime.     albuterol (VENTOLIN HFA) 108 (90 Base) MCG/ACT inhaler SMARTSIG:1 Puff(s) By Mouth Every 4 Hours PRN     betamethasone valerate ointment (VALISONE) 0.1 % Apply 1 application topically 2 (two) times daily. Use prn irritation 15 g 0   norethindrone (MICRONOR) 0.35 MG tablet Take 1 tablet (0.35 mg total) by mouth daily. 84 tablet 3   No current facility-administered medications for this visit.     ALLERGIES: Almond (diagnostic), Apple, and Carrot [daucus carota]  Family History  Problem Relation Age of Onset   Diabetes Father        type II   Throat cancer Father     Social History   Socioeconomic  History   Marital status: Single    Spouse name: Not on file   Number of children: Not on file   Years of education: Not on file   Highest education level: Not on file  Occupational History   Not on file  Tobacco Use   Smoking status: Never   Smokeless tobacco: Never  Substance and Sexual Activity   Alcohol use: No   Drug use: No   Sexual activity: Yes    Partners: Male    Birth control/protection: Condom, Pill  Other Topics Concern   Not on file  Social History Narrative   Not on file   Social Determinants of Health   Financial Resource Strain: Not on file  Food Insecurity: Not on file  Transportation Needs: Not on file  Physical Activity: Not on file  Stress: Not on file  Social Connections: Not on file  Intimate Partner Violence: Not on file    Review of Systems  All other systems reviewed and are negative.  PHYSICAL EXAMINATION:    LMP 12/24/2021     General appearance: alert, cooperative and appears stated age  1. Counseling on sexually transmitted disease HSV serology was positive, but just in the positive range. Confirmatory testing is pending -If testing is +, I will call in Valtrex in case she develops symptoms -We discussed possibly repeating her serology in a few months -We discussed the prevalence of HSV, methods to decrease transmission and possible suppression -Encouraged use of condoms. -All of her questions were answered  Over 20 minutes in total patient care

## 2022-01-22 NOTE — Patient Instructions (Signed)
Genital Herpes °Genital herpes is a common sexually transmitted infection (STI) that is caused by a virus. The virus spreads from person to person through sexual contact. The infection can cause itching, blisters, and sores around the genitals or rectum. Symptoms may last for several days and then go away. This is called an outbreak. The virus remains in the body, however, so more outbreaks may happen in the future. The time between outbreaks varies and can be from months to years. °Genital herpes can affect anyone. It is particularly concerning for pregnant women because the virus can be passed to the baby during delivery and can cause serious problems. Genital herpes is also a concern for people who have a weak disease-fighting system (immune system). °What are the causes? °This condition is caused by the human herpesvirus, also called herpes simplex virus, type 1 or type 2 (HSV-1 or HSV-2). The virus may spread through: °Sexual contact with an infected person, including vaginal, anal, and oral sex. °Contact with fluid from a herpes sore. °The skin. This means that you can get herpes from an infected partner even if there are no blisters or sores present. Your partner may not know that he or she is infected. °What increases the risk? °You are more likely to develop this condition if: °You have sex with many partners. °You do not use latex condoms during sex. °What are the signs or symptoms? °Most people do not have symptoms (are asymptomatic), or they have mild symptoms that may be mistaken for other skin problems. Symptoms may include: °Small, red bumps near the genitals, rectum, or mouth. These bumps turn into blisters and then sores. °Flu-like symptoms, including: °Fever. °Body aches. °Swollen lymph nodes. °Headache. °Painful urination. °Pain and itching in the genital area or rectal area. °Vaginal discharge. °Tingling or shooting pain in the legs and buttocks. °Generally, symptoms are more severe and last  longer during the first (primary) outbreak. Flu-like symptoms are also more common during the primary outbreak. °How is this diagnosed? °This condition may be diagnosed based on: °A physical exam. °Your medical history. °Blood tests. °A test of a fluid sample (culture) from an open sore. °How is this treated? °There is no cure for this condition, but treatment with antiviral medicines that are taken by mouth (orally) can do the following: °Speed up healing and relieve symptoms. °Help to reduce the spread of the virus to sexual partners. °Limit the chance of future outbreaks, or make future outbreaks shorter. °Lessen symptoms of future outbreaks. °Your health care provider may also recommend pain relief medicines, such as aspirin or ibuprofen. °Follow these instructions at home: °If you have an outbreak: °Keep the affected areas dry and clean. °Avoid rubbing or touching blisters and sores. If you do touch blisters or sores: °Wash your hands thoroughly with soap and water. °Do not touch your eyes afterward. °To help relieve pain or itching, you may take the following actions as told by your health care provider: °Apply a cold, wet cloth (cold compress) to affected areas 4-6 times a day. °Apply a substance that protects your skin and reduces bleeding (astringent). °Apply a gel that helps relieve pain around sores (lidocaine gel). °Take a warm, shallow bath that cleans the genital area (sitz bath). °Sexual activity °Do not have sexual contact during active outbreaks. °Practice safe sex. Herpes can spread even if your partner does not have blisters or sores. Latex condoms and female condoms may help prevent the spread of the herpes virus. °General instructions °Take over-the-counter and   prescription medicines only as told by your health care provider. °Keep all follow-up visits as told by your health care provider. This is important. °How is this prevented? °Use condoms. Although you can get genital herpes during sexual  contact even with the use of a condom, a condom can provide some protection. °Avoid having multiple sexual partners. °Talk with your sexual partner about any symptoms either of you may have. Also, talk with your partner about any history of STIs. °Get tested for STIs before you have sex. Ask your partner to do the same. °Do not have sexual contact if you have active symptoms of genital herpes. °Contact a health care provider if: °Your symptoms are not improving with medicine. °Your symptoms return, or you have new symptoms. °You have a fever. °You have abdominal pain. °You have redness, swelling, or pain in your eye. °You notice new sores on other parts of your body. °You are a woman and you experience bleeding between menstrual periods. °You have had herpes and you become pregnant or plan to become pregnant. °Summary °Genital herpes is a common sexually transmitted infection (STI) that is caused by the herpes simplex virus, type 1 or type 2 (HSV-1 or HSV-2). °These viruses are most often spread through sexual contact with an infected person. °You are more likely to develop this condition if you have sex with many partners or you do not use condoms during sex. °Most people do not have symptoms (are asymptomatic) or have mild symptoms that may be mistaken for other skin problems. Symptoms occur as outbreaks that may happen months or years apart. °There is no cure for this condition, but treatment with oral antiviral medicines can reduce symptoms, reduce the chance of spreading the virus to a partner, prevent future outbreaks, or shorten future outbreaks. °This information is not intended to replace advice given to you by your health care provider. Make sure you discuss any questions you have with your health care provider. °Document Revised: 08/13/2021 Document Reviewed: 08/12/2019 °Elsevier Patient Education © 2022 [employer] Inc[employer] ° °

## 2022-01-23 LAB — HERPES SIMPLEX VIRUS 2(IGG) W/ REFLEX TO HSV2 INHIBITION: HSV 2 Glycoprotein G Ab, IgG: 1.98 index — ABNORMAL HIGH

## 2022-01-23 LAB — RPR: RPR Ser Ql: NONREACTIVE

## 2022-01-23 LAB — HSV(HERPES SIMPLEX VRS) I + II AB-IGG
HAV 1 IGG,TYPE SPECIFIC AB: <0.9 index
HSV 2 IGG,TYPE SPECIFIC AB: 2.04 index — ABNORMAL HIGH

## 2022-01-23 LAB — HIV ANTIBODY (ROUTINE TESTING W REFLEX): HIV 1&2 Ab, 4th Generation: NONREACTIVE

## 2022-01-23 LAB — HSV 2 INHIBITION: HSV 2 IGG INHIBITION,IA: POSITIVE — AB

## 2022-01-24 ENCOUNTER — Other Ambulatory Visit: Payer: Self-pay | Admitting: Obstetrics and Gynecology

## 2022-01-24 MED ORDER — VALACYCLOVIR HCL 500 MG PO TABS: ORAL_TABLET | ORAL | 1 refills | Status: DC

## 2022-02-05 ENCOUNTER — Other Ambulatory Visit: Payer: Self-pay | Admitting: Obstetrics and Gynecology

## 2022-05-07 ENCOUNTER — Ambulatory Visit (HOSPITAL_BASED_OUTPATIENT_CLINIC_OR_DEPARTMENT_OTHER): Payer: BC Managed Care – PPO | Admitting: Obstetrics & Gynecology

## 2022-10-30 ENCOUNTER — Encounter: Payer: Self-pay | Admitting: Nurse Practitioner

## 2022-10-30 ENCOUNTER — Ambulatory Visit (INDEPENDENT_AMBULATORY_CARE_PROVIDER_SITE_OTHER): Payer: BC Managed Care – PPO | Admitting: Nurse Practitioner

## 2022-10-30 VITALS — BP 128/72 | HR 74 | Resp 18

## 2022-10-30 DIAGNOSIS — R300 Dysuria: Secondary | ICD-10-CM | POA: Diagnosis not present

## 2022-10-30 DIAGNOSIS — N760 Acute vaginitis: Secondary | ICD-10-CM

## 2022-10-30 DIAGNOSIS — N898 Other specified noninflammatory disorders of vagina: Secondary | ICD-10-CM

## 2022-10-30 DIAGNOSIS — B9689 Other specified bacterial agents as the cause of diseases classified elsewhere: Secondary | ICD-10-CM

## 2022-10-30 LAB — WET PREP FOR TRICH, YEAST, CLUE

## 2022-10-30 MED ORDER — METRONIDAZOLE 0.75 % VA GEL: 1.0000 | Freq: Every day | VAGINAL | 0 refills | Status: AC

## 2022-10-30 NOTE — Progress Notes (Signed)
   Acute Office Visit  Subjective:    Patient ID: Candace Spencer, female    DOB: 30-Apr-2001, 21 y.o.   MRN: [id number]   HPI 21 y.o. presents today for burning with urination, vaginal discharge and mild odor x 2 days. Took AZO last night around 6 pm. Declines STD screening.    Review of Systems  Constitutional: Negative.   Genitourinary:  Positive for dysuria, vaginal discharge and vaginal pain. Negative for frequency, hematuria and urgency.       Vaginal odor       Objective:    Physical Exam Constitutional:      Appearance: Normal appearance.  Genitourinary:    General: Normal vulva.     Vagina: Vaginal discharge and erythema present.     Cervix: Normal.     BP 128/72 (BP Location: Right Arm, Patient Position: Sitting)   Pulse 74   Resp 18   LMP 10/16/2022 (Approximate)   SpO2 97%  Wt Readings from Last 3 Encounters:  01/22/22 136 lb (61.7 kg)  01/10/22 135 lb (61.2 kg)  10/09/21 131 lb 12.8 oz (59.8 kg)        Patient informed chaperone available to be present for breast and/or pelvic exam. Patient has requested no chaperone to be present. Patient has been advised what will be completed during breast and pelvic exam.   Wet prep + clue cells (+ odor) UA negative leukocytes, negative nitrite, 2+ blood, dark yellow/slightly cloudy. Microscopic: wbc 0-5, rbc 10-20, few bacteria  Assessment & Plan:   Problem List Items Addressed This Visit   None Visit Diagnoses     Bacterial vaginosis    -  Primary   Relevant Medications   metroNIDAZOLE (METROGEL) 0.75 % vaginal gel   Vaginal discharge       Relevant Orders   WET PREP FOR TRICH, YEAST, CLUE (Completed)   Burning with urination       Relevant Orders   Urinalysis,Complete w/RFL Culture (Completed)      Plan: Wet prep positive for clue cells - Metrogel 0.75% nightly x 5 nights. UA unremarkable, culture pending.      Olivia Mackie DNP, 2:41 PM 10/30/2022

## 2022-11-02 LAB — URINALYSIS, COMPLETE W/RFL CULTURE
Bilirubin Urine: NEGATIVE
Casts: NONE SEEN /LPF
Crystals: NONE SEEN /HPF
Glucose, UA: NEGATIVE
Hyaline Cast: NONE SEEN /LPF
Ketones, ur: NEGATIVE
Leukocyte Esterase: NEGATIVE
Nitrites, Initial: NEGATIVE
Protein, ur: NEGATIVE
Specific Gravity, Urine: 1.02 (ref 1.001–1.035)
Yeast: NONE SEEN /HPF
pH: 7.5 (ref 5.0–8.0)

## 2022-11-02 LAB — CULTURE INDICATED

## 2022-11-02 LAB — URINE CULTURE
MICRO NUMBER:: 14192770
SPECIMEN QUALITY:: ADEQUATE

## 2022-11-04 ENCOUNTER — Other Ambulatory Visit: Payer: Self-pay | Admitting: *Deleted

## 2022-11-04 MED ORDER — NITROFURANTOIN MONOHYD MACRO 100 MG PO CAPS: 100.0000 mg | ORAL_CAPSULE | Freq: Two times a day (BID) | ORAL | 0 refills | Status: DC

## 2023-01-19 ENCOUNTER — Other Ambulatory Visit: Payer: Self-pay | Admitting: Obstetrics and Gynecology

## 2023-01-19 DIAGNOSIS — Z3041 Encounter for surveillance of contraceptive pills: Secondary | ICD-10-CM

## 2023-01-21 NOTE — Telephone Encounter (Signed)
Shaffer, Claudia  Leafy Motsinger D, CMA Left message for patient to call and schedule appointment. 

## 2023-01-21 NOTE — Telephone Encounter (Signed)
Last AEX 01/10/2022--recall letter was sent, nothing currently scheduled.  Will send msg to appt desk to contact pt.   Per pharmacy: last refill picked up on 11/04/2022 for #84 tabs--script should finish by next week.

## 2023-01-24 NOTE — Telephone Encounter (Signed)
Nothing scheduled as of yet. Please advise.

## 2023-01-28 NOTE — Telephone Encounter (Signed)
Tillie Rung D, CMA GCG/Two messages were left for patient to call and schedule appointment; patient did not return our call.

## 2023-02-21 ENCOUNTER — Telehealth: Payer: BC Managed Care – PPO | Admitting: Physician Assistant

## 2023-02-21 DIAGNOSIS — R3989 Other symptoms and signs involving the genitourinary system: Secondary | ICD-10-CM

## 2023-02-21 MED ORDER — NITROFURANTOIN MONOHYD MACRO 100 MG PO CAPS: 100.0000 mg | ORAL_CAPSULE | Freq: Two times a day (BID) | ORAL | 0 refills | Status: DC

## 2023-02-21 NOTE — Progress Notes (Signed)
E-Visit for Urinary Problems  We are sorry that you are not feeling well.  Here is how we plan to help!  Based on what you shared with me it looks like you most likely have a simple urinary tract infection.  A UTI (Urinary Tract Infection) is a bacterial infection of the bladder.  Most cases of urinary tract infections are simple to treat but a key part of your care is to encourage you to drink plenty of fluids and watch your symptoms carefully.  I have prescribed MacroBid 100 mg twice a day for 5 days.  Your symptoms should gradually improve. Call us if the burning in your urine worsens, you develop worsening fever, back pain or pelvic pain or if your symptoms do not resolve after completing the antibiotic.  Urinary tract infections can be prevented by drinking plenty of water to keep your body hydrated.  Also be sure when you wipe, wipe from front to back and don't hold it in!  If possible, empty your bladder every 4 hours.  HOME CARE Drink plenty of fluids Compete the full course of the antibiotics even if the symptoms resolve Remember, when you need to go.go. Holding in your urine can increase the likelihood of getting a UTI! GET HELP RIGHT AWAY IF: You cannot urinate You get a high fever Worsening back pain occurs You see blood in your urine You feel sick to your stomach or throw up You feel like you are going to pass out  MAKE SURE YOU  Understand these instructions. Will watch your condition. Will get help right away if you are not doing well or get worse.   Thank you for choosing an e-visit.  Your e-visit answers were reviewed by a board certified advanced clinical practitioner to complete your personal care plan. Depending upon the condition, your plan could have included both over the counter or prescription medications.  Please review your pharmacy choice. Make sure the pharmacy is open so you can pick up prescription now. If there is a problem, you may contact your  provider through MyChart messaging and have the prescription routed to another pharmacy.  Your safety is important to us. If you have drug allergies check your prescription carefully.   For the next 24 hours you can use MyChart to ask questions about today's visit, request a non-urgent call back, or ask for a work or school excuse. You will get an email in the next two days asking about your experience. I hope that your e-visit has been valuable and will speed your recovery.  I have spent 5 minutes in review of e-visit questionnaire, review and updating patient chart, medical decision making and response to patient.   Deana Krock M Renly Roots, PA-C  

## 2023-04-09 ENCOUNTER — Telehealth: Payer: BC Managed Care – PPO | Admitting: Nurse Practitioner

## 2023-04-09 DIAGNOSIS — N760 Acute vaginitis: Secondary | ICD-10-CM

## 2023-04-09 DIAGNOSIS — B9689 Other specified bacterial agents as the cause of diseases classified elsewhere: Secondary | ICD-10-CM

## 2023-04-09 MED ORDER — METRONIDAZOLE 0.75 % VA GEL: 1.0000 | Freq: Every day | VAGINAL | 0 refills | Status: AC

## 2023-04-09 NOTE — Progress Notes (Signed)
E-Visit for Vaginal Symptoms  We are sorry that you are not feeling well. Here is how we plan to help! Based on what you shared with me it looks like you: May have a vaginosis due to bacteria  Vaginosis is an inflammation of the vagina that can result in discharge, itching and pain. The cause is usually a change in the normal balance of vaginal bacteria or an infection. Vaginosis can also result from reduced estrogen levels after menopause.  The most common causes of vaginosis are:   Bacterial vaginosis which results from an overgrowth of one on several organisms that are normally present in your vagina.   Yeast infections which are caused by a naturally occurring fungus called candida.   Vaginal atrophy (atrophic vaginosis) which results from the thinning of the vagina from reduced estrogen levels after menopause.   Trichomoniasis which is caused by a parasite and is commonly transmitted by sexual intercourse.  Factors that increase your risk of developing vaginosis include: Medications, such as antibiotics and steroids Uncontrolled diabetes Use of hygiene products such as bubble bath, vaginal spray or vaginal deodorant Douching Wearing damp or tight-fitting clothing Using an intrauterine device (IUD) for birth control Hormonal changes, such as those associated with pregnancy, birth control pills or menopause Sexual activity Having a sexually transmitted infection  Your treatment plan is : Meds ordered this encounter  Medications   metroNIDAZOLE (METROGEL) 0.75 % vaginal gel    Sig: Place 1 Applicatorful vaginally at bedtime for 7 days.    Dispense:  70 g    Refill:  0     Be sure to take all of the medication as directed. Stop taking any medication if you develop a rash, tongue swelling or shortness of breath. Mothers who are breast feeding should consider pumping and discarding their breast milk while on these antibiotics. However, there is no consensus that infant exposure at  these doses would be harmful.  Remember that medication creams can weaken latex condoms. .   HOME CARE:  Good hygiene may prevent some types of vaginosis from recurring and may relieve some symptoms:  Avoid baths, hot tubs and whirlpool spas. Rinse soap from your outer genital area after a shower, and dry the area well to prevent irritation. Don't use scented or harsh soaps, such as those with deodorant or antibacterial action. Avoid irritants. These include scented tampons and pads. Wipe from front to back after using the toilet. Doing so avoids spreading fecal bacteria to your vagina.  Other things that may help prevent vaginosis include:  Don't douche. Your vagina doesn't require cleansing other than normal bathing. Repetitive douching disrupts the normal organisms that reside in the vagina and can actually increase your risk of vaginal infection. Douching won't clear up a vaginal infection. Use a latex condom. Both female and female latex condoms may help you avoid infections spread by sexual contact. Wear cotton underwear. Also wear pantyhose with a cotton crotch. If you feel comfortable without it, skip wearing underwear to bed. Yeast thrives in moist environments Your symptoms should improve in the next day or two.  GET HELP RIGHT AWAY IF:  You have pain in your lower abdomen ( pelvic area or over your ovaries) You develop nausea or vomiting You develop a fever Your discharge changes or worsens You have persistent pain with intercourse You develop shortness of breath, a rapid pulse, or you faint.  These symptoms could be signs of problems or infections that need to be evaluated by a   medical provider now.  MAKE SURE YOU   Understand these instructions. Will watch your condition. Will get help right away if you are not doing well or get worse.  Thank you for choosing an e-visit.  Your e-visit answers were reviewed by a board certified advanced clinical practitioner to  complete your personal care plan. Depending upon the condition, your plan could have included both over the counter or prescription medications.  Please review your pharmacy choice. Make sure the pharmacy is open so you can pick up prescription now. If there is a problem, you may contact your provider through MyChart messaging and have the prescription routed to another pharmacy.  Your safety is important to us. If you have drug allergies check your prescription carefully.   For the next 24 hours you can use MyChart to ask questions about today's visit, request a non-urgent call back, or ask for a work or school excuse. You will get an email in the next two days asking about your experience. I hope that your e-visit has been valuable and will speed your recovery.   I spent approximately 5 minutes reviewing the patient's history, current symptoms and coordinating their care today.   

## 2023-04-27 ENCOUNTER — Other Ambulatory Visit: Payer: Self-pay | Admitting: Obstetrics and Gynecology

## 2023-04-27 DIAGNOSIS — Z3041 Encounter for surveillance of contraceptive pills: Secondary | ICD-10-CM

## 2023-04-28 NOTE — Telephone Encounter (Signed)
Med refill request: Micronor 0.35 mg tab PO daily Last AEX: 01/10/22 -JJ Next AEX: 05/02/23 -JJ Last MMG (if hormonal med) N/A  Rx pended #84/0RF Keep AEX for further refills Refill authorized: Please Advise?

## 2023-05-02 ENCOUNTER — Other Ambulatory Visit (HOSPITAL_COMMUNITY)
Admission: RE | Admit: 2023-05-02 | Discharge: 2023-05-02 | Disposition: A | Discharge status: Home / Self Care | Payer: BC Managed Care – PPO | Source: Ambulatory Visit | Attending: Obstetrics and Gynecology | Admitting: Obstetrics and Gynecology

## 2023-05-02 ENCOUNTER — Ambulatory Visit (INDEPENDENT_AMBULATORY_CARE_PROVIDER_SITE_OTHER): Payer: BC Managed Care – PPO | Admitting: Obstetrics and Gynecology

## 2023-05-02 ENCOUNTER — Encounter: Payer: Self-pay | Admitting: Obstetrics and Gynecology

## 2023-05-02 VITALS — BP 120/82 | HR 80 | Resp 20 | Ht 63.98 in | Wt 129.4 lb

## 2023-05-02 DIAGNOSIS — Z8342 Family history of familial hypercholesterolemia: Secondary | ICD-10-CM

## 2023-05-02 DIAGNOSIS — Z124 Encounter for screening for malignant neoplasm of cervix: Secondary | ICD-10-CM | POA: Diagnosis present

## 2023-05-02 DIAGNOSIS — Z01419 Encounter for gynecological examination (general) (routine) without abnormal findings: Secondary | ICD-10-CM | POA: Diagnosis not present

## 2023-05-02 DIAGNOSIS — Z113 Encounter for screening for infections with a predominantly sexual mode of transmission: Secondary | ICD-10-CM | POA: Diagnosis present

## 2023-05-02 DIAGNOSIS — Z0000 Encounter for general adult medical examination without abnormal findings: Secondary | ICD-10-CM

## 2023-05-02 DIAGNOSIS — Z833 Family history of diabetes mellitus: Secondary | ICD-10-CM

## 2023-05-02 DIAGNOSIS — Z13220 Encounter for screening for lipoid disorders: Secondary | ICD-10-CM

## 2023-05-02 DIAGNOSIS — Z3041 Encounter for surveillance of contraceptive pills: Secondary | ICD-10-CM

## 2023-05-02 MED ORDER — NORETHINDRONE 0.35 MG PO TABS: 1.0000 | ORAL_TABLET | Freq: Every day | ORAL | 3 refills | Status: DC

## 2023-05-02 NOTE — Progress Notes (Signed)
22 y.o. G0P0000 Single White or Caucasian Not Hispanic or Latino female here for annual exam.  She is on micronor. Period Duration (Days): 2-3 Period Pattern: Regular Menstrual Flow: Heavy Menstrual Control: Tampon Menstrual Control Change Freq (Hours): 8 Dysmenorrhea: (!) Moderate Dysmenorrhea Symptoms: Cramping, Nausea, Diarrhea, Headache  H/O HSV 2, has never had an outbreak.   Sexually active, same partner x 2 months, using condoms.   Patient's last menstrual period was 04/21/2023 (exact date).          Sexually active: Yes.    The current method of family planning is POP and condoms most of the time.    Exercising: Yes.     Jog Smoker:  no  Health Maintenance: Pap:  None History of abnormal Pap:  no MMG:  N/A BMD:   N/A Colonoscopy: N/A TDaP:  01/10/22 Gardasil: completed   reports that she has never smoked. She has never used smokeless tobacco. She reports that she does not drink alcohol and does not use drugs. 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   Past Medical History:  Diagnosis Date   ADHD (attention deficit hyperactivity disorder)    Asthma    Migraine with aura    auditroy aura    Past Surgical History:  Procedure Laterality Date   CRANIECTOMY FOR CRANIOSYNOSTOSIS  2013   done at [facility] TOOTH EXTRACTION      Current Outpatient Medications  Medication Sig Dispense Refill   ADDERALL XR 20 MG 24 hr capsule Take 20 mg by mouth at bedtime.     albuterol (VENTOLIN HFA) 108 (90 Base) MCG/ACT inhaler SMARTSIG:1 Puff(s) By Mouth Every 4 Hours PRN     EPINEPHrine 0.3 mg/0.3 mL IJ SOAJ injection SMARTSIG:injection IM As Directed     norethindrone (MICRONOR) 0.35 MG tablet TAKE 1 TABLET BY MOUTH EVERY DAY 84 tablet 0   valACYclovir (VALTREX) 500 MG tablet Take one tablet po BID x 3 days prn 30 tablet 1   No current facility-administered medications for this visit.    Family History   Problem Relation Age of Onset   Diabetes Father        type II   Throat cancer Father     Review of Systems  All other systems reviewed and are negative.   Exam:   BP 120/82 (BP Location: Right Arm, Patient Position: Sitting)   Pulse 80   Resp 20   Ht 5' 3.98" (1.625 m) Comment: with shoes  Wt 129 lb 6.4 oz (58.7 kg)   LMP 04/21/2023 (Exact Date)   SpO2 98%   BMI 22.23 kg/m   Weight change: @WEIGHTCHANGE @ Height:   Height: 5' 3.98" (162.5 cm) (with shoes)  Ht Readings from Last 3 Encounters:  05/02/23 5' 3.98" (1.625 m)  01/22/22 5\' 3"  (1.6 m)  01/10/22 5\' 3"  (1.6 m)    General appearance: alert, cooperative and appears stated age Head: Normocephalic, without obvious abnormality, atraumatic Neck: no adenopathy, supple, symmetrical, trachea midline and thyroid normal to inspection and palpation Lungs: clear to auscultation bilaterally Cardiovascular: regular rate and rhythm Breasts: normal appearance, no masses or tenderness Abdomen: soft, non-tender; non distended,  no masses,  no organomegaly Extremities: extremities normal, atraumatic, no cyanosis or edema Skin: Skin color, texture, turgor normal. No rashes or lesions Lymph nodes: Cervical, supraclavicular, and axillary nodes normal. No abnormal inguinal nodes palpated Neurologic: Grossly normal   Pelvic: External  genitalia:  no lesions              Urethra:  normal appearing urethra with no masses, tenderness or lesions              Bartholins and Skenes: normal                 Vagina: normal appearing vagina with normal color and discharge, no lesions              Cervix: no lesions               Bimanual Exam:  Uterus:  normal size, contour, position, consistency, mobility, non-tender and anteverted              Adnexa: no mass, fullness, tenderness               Rectovaginal: declined  Carmelina Dane, RN chaperoned for the exam.  1. Well woman exam Discussed breast self exam Discussed calcium and vit D  intake  2. Screening for cervical cancer - Cytology - PAP  3. Screening examination for STD (sexually transmitted disease) - RPR - HIV Antibody (routine testing w rflx) - HEP, RPR, HIV Panel - Cytology - PAP  4. Surveillance of previously prescribed contraceptive pill Doing well  - norethindrone (MICRONOR) 0.35 MG tablet; Take 1 tablet (0.35 mg total) by mouth daily.  Dispense: 84 tablet; Refill: 3  5. Screening, lipid - Lipid panel  6. Laboratory exam ordered as part of routine general medical examination - CBC - Lipid panel - Comprehensive metabolic panel  7. Family history of familial hypercholesterolemia - Lipid panel  8. Family history of diabetes mellitus (DM) - Comprehensive metabolic panel

## 2023-05-02 NOTE — Patient Instructions (Signed)

## 2023-05-02 NOTE — Addendum Note (Signed)
Addended by: Rushie Goltz on: 05/02/2023 09:33 AM   Modules accepted: Orders

## 2023-05-03 LAB — LIPID PANEL
Cholesterol: 162 mg/dL (ref <200)
HDL: 68 mg/dL (ref >=50)
LDL Cholesterol (Calc): 85 mg/dL (calc)
Non-HDL Cholesterol (Calc): 94 mg/dL (calc) (ref <130)
Total CHOL/HDL Ratio: 2.4 (calc) (ref <5.0)
Triglycerides: 33 mg/dL (ref <150)

## 2023-05-03 LAB — COMPREHENSIVE METABOLIC PANEL
AG Ratio: 2.1 (calc) (ref 1.0–2.5)
ALT: 16 U/L (ref 6–29)
AST: 20 U/L (ref 10–30)
Albumin: 4.9 g/dL (ref 3.6–5.1)
Alkaline phosphatase (APISO): 52 U/L (ref 31–125)
BUN/Creatinine Ratio: 35 (calc) — ABNORMAL HIGH (ref 6–22)
BUN: 27 mg/dL — ABNORMAL HIGH (ref 7–25)
CO2: 24 mmol/L (ref 20–32)
Calcium: 9.7 mg/dL (ref 8.6–10.2)
Chloride: 100 mmol/L (ref 98–110)
Creat: 0.77 mg/dL (ref 0.50–0.96)
Globulin: 2.3 g/dL (calc) (ref 1.9–3.7)
Glucose, Bld: 78 mg/dL (ref 65–99)
Potassium: 3.5 mmol/L (ref 3.5–5.3)
Sodium: 138 mmol/L (ref 135–146)
Total Bilirubin: 1 mg/dL (ref 0.2–1.2)
Total Protein: 7.2 g/dL (ref 6.1–8.1)

## 2023-05-03 LAB — CBC
HCT: 41.6 % (ref 35.0–45.0)
Hemoglobin: 13.9 g/dL (ref 11.7–15.5)
MCH: 35.3 pg — ABNORMAL HIGH (ref 27.0–33.0)
MCHC: 33.4 g/dL (ref 32.0–36.0)
MCV: 105.6 fL — ABNORMAL HIGH (ref 80.0–100.0)
MPV: 9.3 fL (ref 7.5–12.5)
Platelets: 291 10*3/uL (ref 140–400)
RBC: 3.94 10*6/uL (ref 3.80–5.10)
RDW: 11.7 % (ref 11.0–15.0)
WBC: 8.5 10*3/uL (ref 3.8–10.8)

## 2023-05-03 LAB — RPR: RPR Ser Ql: NONREACTIVE

## 2023-05-03 LAB — HIV ANTIBODY (ROUTINE TESTING W REFLEX): HIV 1&2 Ab, 4th Generation: NONREACTIVE

## 2023-05-07 LAB — CYTOLOGY - PAP
Chlamydia: NEGATIVE
Comment: NEGATIVE
Comment: NEGATIVE
Comment: NORMAL
Diagnosis: NEGATIVE
Neisseria Gonorrhea: NEGATIVE
Trichomonas: NEGATIVE

## 2023-08-24 ENCOUNTER — Telehealth: Payer: BC Managed Care – PPO | Admitting: Physician Assistant

## 2023-08-24 DIAGNOSIS — R399 Unspecified symptoms and signs involving the genitourinary system: Secondary | ICD-10-CM | POA: Diagnosis not present

## 2023-08-24 MED ORDER — FLUCONAZOLE 150 MG PO TABS: 150.0000 mg | ORAL_TABLET | Freq: Every day | ORAL | 0 refills | Status: DC

## 2023-08-24 MED ORDER — NITROFURANTOIN MONOHYD MACRO 100 MG PO CAPS: 100.0000 mg | ORAL_CAPSULE | Freq: Two times a day (BID) | ORAL | 0 refills | Status: AC

## 2023-08-24 NOTE — Progress Notes (Signed)
E-Visit for Urinary Problems  We are sorry that you are not feeling well.  Here is how we plan to help!  Based on what you shared with me it looks like you most likely have a simple urinary tract infection.  A UTI (Urinary Tract Infection) is a bacterial infection of the bladder.  Most cases of urinary tract infections are simple to treat but a key part of your care is to encourage you to drink plenty of fluids and watch your symptoms carefully.  I have prescribed MacroBid 100 mg twice a day for 5 days.  Your symptoms should gradually improve. Call us if the burning in your urine worsens, you develop worsening fever, back pain or pelvic pain or if your symptoms do not resolve after completing the antibiotic.  I have also sent in Diflucan for possible yeast infection given vaginal itching.   Urinary tract infections can be prevented by drinking plenty of water to keep your body hydrated.  Also be sure when you wipe, wipe from front to back and don't hold it in!  If possible, empty your bladder every 4 hours.  HOME CARE Drink plenty of fluids Compete the full course of the antibiotics even if the symptoms resolve Remember, when you need to go.go. Holding in your urine can increase the likelihood of getting a UTI! GET HELP RIGHT AWAY IF: You cannot urinate You get a high fever Worsening back pain occurs You see blood in your urine You feel sick to your stomach or throw up You feel like you are going to pass out  MAKE SURE YOU  Understand these instructions. Will watch your condition. Will get help right away if you are not doing well or get worse.   Thank you for choosing an e-visit.  Your e-visit answers were reviewed by a board certified advanced clinical practitioner to complete your personal care plan. Depending upon the condition, your plan could have included both over the counter or prescription medications.  Please review your pharmacy choice. Make sure the pharmacy is open  so you can pick up prescription now. If there is a problem, you may contact your provider through [employer] and have the prescription routed to another pharmacy.  Your safety is important to [place]. If you have drug allergies check your prescription carefully.   For the next 24 hours you can use MyChart to ask questions about today's visit, request a non-urgent call back, or ask for a work or school excuse. You will get an email in the next two days asking about your experience. I hope that your e-visit has been valuable and will speed your recovery.   I have spent 5 minutes in review of e-visit questionnaire, review and updating patient chart, medical decision making and response to patient.   Tylene Fantasia Ward, PA-C

## 2023-09-04 ENCOUNTER — Telehealth: Payer: BC Managed Care – PPO | Admitting: Physician Assistant

## 2023-09-04 DIAGNOSIS — B351 Tinea unguium: Secondary | ICD-10-CM

## 2023-09-05 NOTE — Progress Notes (Signed)
E-Visit for Athlete's Foot  We are sorry that you are not feeling well. Here is how we plan to help!  Based on what you shared with me it looks like you have onychomycosis, or toe nail fungus.  Toe Nail fungus can usually be treated with over-the-counter topical antifungal products; but sometimes with chronic or extensive tinea pedis, prescription oral medications are needed.   I am recommending: on OTC topical liquid to apply to the nail bed for 12 weeks.   The topicals work best, but most do not use them long enough. Most treatments take 3-6 months. The discolored part of the nail will not change. What you look for is new, healthy nail to be growing up from the nail bed. You will be eventually trimming away the infected part.   HOME CARE:  Keep feet clean, dry, and cool. Avoid using swimming pools, public showers, or foot baths. Wear sandals when possible or air shoes out by alternating them every 2-3 days. Avoid wearing closed shoes and wearing socks made from fabric that doesn't dry easily (for example, nylon). Treat the infection with recommended medication  GET HELP RIGHT AWAY IF:  Symptoms that don't go away after treatment. Severe itching that persists. If your rash spreads or swells. If your rash begins to have drainage or smell. You develop a fever.  MAKE SURE YOU   Understand these instructions. Will watch your condition. Will get help right away if you are not doing well or get worse.   Thank you for choosing an e-visit.  Your e-visit answers were reviewed by a board certified advanced clinical practitioner to complete your personal care plan. Depending upon the condition, your plan could have included both over the counter or prescription medications.  Please review your pharmacy choice. Make sure the pharmacy is open so you can pick up prescription now. If there is a problem, you may contact your provider through [employer] and have the  prescription routed to another pharmacy.  Your safety is important to [place]. If you have drug allergies check your prescription carefully.   For the next 24 hours you can use MyChart to ask questions about today's visit, request a non-urgent call back, or ask for a work or school excuse.  You will get an email in the next two days asking about your experience. I hope that your e-visit has been valuable and will speed your recovery  References or for more information:  [employer]com.au?search=[email or web address]%20treatment&source=search_result&selectedTitle=1~104&usage_type=default&display_rank=1  [employer]ee        I have spent 5 minutes in review of e-visit questionnaire, review and updating patient chart, medical decision making and response to patient.   Margaretann Loveless, PA-C

## 2024-03-14 ENCOUNTER — Telehealth: Admitting: Nurse Practitioner

## 2024-03-14 DIAGNOSIS — K219 Gastro-esophageal reflux disease without esophagitis: Secondary | ICD-10-CM

## 2024-03-14 MED ORDER — FAMOTIDINE 20 MG PO TABS: 20.0000 mg | ORAL_TABLET | Freq: Two times a day (BID) | ORAL | 0 refills | Status: DC

## 2024-03-14 NOTE — Progress Notes (Signed)
 I have spent 5 minutes in review of e-visit questionnaire, review and updating patient chart, medical decision making and response to patient.   Claiborne Rigg, NP

## 2024-03-14 NOTE — Progress Notes (Signed)
 E-Visit for Heartburn  We are sorry that you are not feeling well.  Here is how we plan to help!  Based on what you shared with me it looks like you most likely have Gastroesophageal Reflux Disease (GERD)  Gastroesophageal reflux disease (GERD) happens when acid from your stomach flows up into the esophagus.  When acid comes in contact with the esophagus, the acid causes sorenss (inflammation) in the esophagus.  Over time, GERD may create small holes (ulcers) in the lining of the esophagus.  I recommend Pepcid 20mg  one by mouth twice a day for two weeks.  Your symptoms should improve in the next day or two.  You can use antacids as needed until symptoms resolve.  Call us if your heartburn worsens, you have trouble swallowing, weight loss, spitting up blood or recurrent vomiting.  Home Care: May include lifestyle changes such as weight loss, quitting smoking and alcohol consumption Avoid foods and drinks that make your symptoms worse, such as: Caffeine or alcoholic drinks Chocolate Peppermint or mint flavorings Garlic and onions Spicy foods Citrus fruits, such as oranges, lemons, or limes Tomato-based foods such as sauce, chili, salsa and pizza Fried and fatty foods Avoid lying down for 3 hours prior to your bedtime or prior to taking a nap Eat small, frequent meals instead of a large meals Wear loose-fitting clothing.  Do not wear anything tight around your waist that causes pressure on your stomach. Raise the head of your bed 6 to 8 inches with wood blocks to help you sleep.  Extra pillows will not help.  Seek Help Right Away If: You have pain in your arms, neck, jaw, teeth or back Your pain increases or changes in intensity or duration You develop nausea, vomiting or sweating (diaphoresis) You develop shortness of breath or you faint Your vomit is green, yellow, black or looks like coffee grounds or blood Your stool is red, bloody or black  These symptoms could be signs of other  problems, such as heart disease, gastric bleeding or esophageal bleeding.  Make sure you : Understand these instructions. Will watch your condition. Will get help right away if you are not doing well or get worse.  Your e-visit answers were reviewed by a board certified advanced clinical practitioner to complete your personal care plan.  Depending on the condition, your plan could have included both over the counter or prescription medications.  If there is a problem please reply  once you have received a response from your provider.  Your safety is important to [place].  If you have drug allergies check your prescription carefully.    You can use MyChart to ask questions about today's visit, request a non-urgent call back, or ask for a work or school excuse for 24 hours related to this e-Visit. If it has been greater than 24 hours you will need to follow up with your provider, or enter a new e-Visit to address those concerns.  You will get an e-mail in the next two days asking about your experience.  I hope that your e-visit has been valuable and will speed your recovery. Thank you for using e-visits.

## 2024-03-28 ENCOUNTER — Other Ambulatory Visit: Payer: Self-pay | Admitting: Nurse Practitioner

## 2024-03-28 DIAGNOSIS — K219 Gastro-esophageal reflux disease without esophagitis: Secondary | ICD-10-CM

## 2024-07-04 ENCOUNTER — Telehealth: Admitting: Family

## 2024-07-04 DIAGNOSIS — S61210A Laceration without foreign body of right index finger without damage to nail, initial encounter: Secondary | ICD-10-CM | POA: Diagnosis not present

## 2024-07-04 NOTE — Progress Notes (Signed)
 E-Visit for Simple Cut/Laceration  We are sorry that you have had an injury. Here is how we plan to help!   Based on what you shared with me it looks like you have a simple laceration that does not need to be repaired with stitches or tissue glue.   Your TDAP is up to date and had it 2023.  HOME CARE: Clean the cut or scrape - Wash it well with soap and water. * avoid using hydrogen peroxide which may cause tissue damage, or impede wound healing.  Stop the bleeding - If your cut or scrape is bleeding, press a clean cloth or bandage firmly on the area for 20 minutes. You can also help slow the bleeding by holding the cut above the level of your heart.   Put a thin layer of Bacitracin antibiotic ointment on the cut or scrape. (this can be purchased at any local pharmacy- ask your pharmacist if you need assistance)   Cover the cut or scrape with a bandage or gauze. Keep the bandage clean and dry. Change the bandage 1 to 2 times every day until your cut or scrape heals.   Watch for signs that your cut or scrape is infected (redness, drainage, pain, warmth, swelling or fever)  Over the next 48 hours your wound should start to improve with less pain, less swelling and less redness. If you should develop increasing pain, swelling, redness, fever, pus from the wound you should be seen immediately to make sure this is not becoming infected.   WOUND CARE: Please keep a layer of antibiotic ointment (bacitracin preferred) on this wound at least twice a day for the next seven days and keep a sterile dressing over top of it. You may gently clean the wound with warm soap and water between dressing changes.  We strongly recommend that you have a medical provider reevaluate your wound within 2 to 3 days in person to make sure that it is healing appropriately.  Thank you for choosing an e-visit.  Your e-visit answers were reviewed by a board certified advanced clinical practitioner to complete your  personal care plan. Depending upon the condition, your plan could have included both over the counter or prescription medications.  Please review your pharmacy choice. Make sure the pharmacy is open so you can pick up prescription now. If there is a problem, you may contact your provider through [employer] and have the prescription routed to another pharmacy.  Your safety is important to us . If you have drug allergies check your prescription carefully.   For the next 24 hours you can use MyChart to ask questions about today's visit, request a non-urgent call back, or ask for a work or school excuse. You will get an email in the next two days asking about your experience. I hope that your e-visit has been valuable and will speed your recovery.  Approximately 5 minutes was spent documenting and reviewing patient's chart.

## 2024-07-04 NOTE — Addendum Note (Signed)
 Addended by: LAVELL LYE A on: 07/04/2024 04:58 PM   Modules accepted: Level of Service

## 2024-07-04 NOTE — Progress Notes (Signed)
 Hello, Can you please attach a picture of your cut?   Thanks,  Bari Learn, FNP

## 2024-08-06 ENCOUNTER — Telehealth: Admitting: Physician Assistant

## 2024-08-06 DIAGNOSIS — N3090 Cystitis, unspecified without hematuria: Secondary | ICD-10-CM | POA: Diagnosis not present

## 2024-08-06 MED ORDER — CEPHALEXIN 500 MG PO CAPS: 500.0000 mg | ORAL_CAPSULE | Freq: Two times a day (BID) | ORAL | 0 refills | Status: AC

## 2024-08-06 NOTE — Progress Notes (Signed)

## 2024-08-09 ENCOUNTER — Ambulatory Visit: Admitting: Nurse Practitioner

## 2024-10-07 ENCOUNTER — Ambulatory Visit: Admitting: Nurse Practitioner

## 2024-10-12 ENCOUNTER — Ambulatory Visit: Admitting: Nurse Practitioner

## 2024-10-31 ENCOUNTER — Telehealth: Admitting: Family

## 2024-10-31 ENCOUNTER — Other Ambulatory Visit: Payer: Self-pay | Admitting: Family

## 2024-10-31 DIAGNOSIS — R399 Unspecified symptoms and signs involving the genitourinary system: Secondary | ICD-10-CM

## 2024-10-31 MED ORDER — CEPHALEXIN 500 MG PO CAPS: 500.0000 mg | ORAL_CAPSULE | Freq: Two times a day (BID) | ORAL | 0 refills | Status: DC

## 2024-10-31 NOTE — Progress Notes (Signed)

## 2024-11-15 NOTE — Progress Notes (Unsigned)
 Candace Spencer 06/30/2001 [id number]   History:  23 y.o. G0 presents for annual exam. Stopped POPs - did not like irregular bleeding. Normal pap history. HSV - occasional outbreaks on mouth. H/O migraine with aura. Reports intermittent breast pain, sometimes unilateral and sometimes bilateral, thinks could be cyclic.   Gynecologic History Patient's last menstrual period was 11/11/2024 (exact date). Period Duration (Days): 3 Period Pattern: Regular Menstrual Flow: Moderate Menstrual Control: Tampon, Maxi pad Dysmenorrhea: (!) Moderate Dysmenorrhea Symptoms: Cramping, Headache Contraception/Family planning: condoms Sexually active: Yes, declines STD screening  Health Maintenance Last Pap: 05/02/2023. Results were: Normal Last mammogram: Not indicated Last colonoscopy: Not indicated Last Dexa: Not indicated      11/16/2024    7:54 AM  Depression screen PHQ 2/9  Decreased Interest 1  Down, Depressed, Hopeless 1  PHQ - 2 Score 2  Altered sleeping 0  Tired, decreased energy 1  Change in appetite 1  Feeling bad or failure about yourself  2  Trouble concentrating 1  Moving slowly or fidgety/restless 1  Suicidal thoughts 0  PHQ-9 Score 8  Difficult doing work/chores Somewhat difficult     Past medical history, past surgical history, family history and social history were all reviewed and documented in the EPIC chart. Long-term boyfriend. 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   ROS:  A ROS was performed and pertinent positives and negatives are included.  Exam:  Vitals:   11/16/24 0746  BP: 110/68  Pulse: 73  SpO2: 98%  Weight: 130 lb (59 kg)  Height: 5' 3.75 (1.619 m)   Body mass index is 22.49 kg/m.  General appearance:  Normal Thyroid:  Symmetrical, normal in size, without palpable masses or nodularity. Respiratory  Auscultation:  Clear without wheezing or rhonchi Cardiovascular  Auscultation:  Regular rate, without rubs, murmurs  or gallops  Edema/varicosities:  Not grossly evident Abdominal  Soft,nontender, without masses, guarding or rebound.  Liver/spleen:  No organomegaly noted  Hernia:  None appreciated  Skin  Inspection:  Grossly normal Breasts: Examined lying and sitting.   Right: Without masses, retractions, nipple discharge or axillary adenopathy.   Left: Without masses, retractions, nipple discharge or axillary adenopathy. Pelvic: External genitalia:  no lesions              Urethra:  normal appearing urethra with no masses, tenderness or lesions              Bartholins and Skenes: normal                 Vagina: normal appearing vagina with normal color and discharge, no lesions              Cervix: no lesions Bimanual Exam:  Uterus:  no masses or tenderness              Adnexa: no mass, fullness, tenderness              Rectovaginal: Deferred              Anus:  normal, no lesions  Zada Louder, CMA present as chaperone.   Assessment/Plan:  23 y.o. G0 for annual exam.   Well female exam with routine gynecological exam - Education provided on SBEs, importance of preventative screenings, current guidelines, high calcium diet, regular exercise, and multivitamin daily. Labs with PCP.   Depression screening - PHQ - 8, managed by PCP.   Cyclical breast pain - breast pain is cyclic. Educated on how  to perform self breast exams.   Screening for cervical cancer - Normal Pap history.  Will repeat at 3-year interval per guidelines.  Return in about 1 year (around 11/16/2025) for Annual.     Annabella DELENA Shutter DNP, 8:14 AM 11/16/2024

## 2024-11-16 ENCOUNTER — Encounter: Payer: Self-pay | Admitting: Nurse Practitioner

## 2024-11-16 ENCOUNTER — Ambulatory Visit: Admitting: Nurse Practitioner

## 2024-11-16 VITALS — BP 110/68 | HR 73 | Ht 63.75 in | Wt 130.0 lb

## 2024-11-16 DIAGNOSIS — N644 Mastodynia: Secondary | ICD-10-CM

## 2024-11-16 DIAGNOSIS — Z01419 Encounter for gynecological examination (general) (routine) without abnormal findings: Secondary | ICD-10-CM | POA: Diagnosis not present

## 2024-11-16 DIAGNOSIS — Z1331 Encounter for screening for depression: Secondary | ICD-10-CM

## 2024-11-16 DIAGNOSIS — Z3041 Encounter for surveillance of contraceptive pills: Secondary | ICD-10-CM

## 2025-11-17 ENCOUNTER — Ambulatory Visit: Admitting: Nurse Practitioner
